# Patient Record
Sex: Male | Born: 1935 | Race: White | Hispanic: No | Marital: Married | Smoking: Former smoker
Health system: Southern US, Community
[De-identification: ages and names within clinical notes are randomized; demographics above are authoritative.]

## PROBLEM LIST (undated history)

## (undated) DIAGNOSIS — IMO0001 Reserved for inherently not codable concepts without codable children: Secondary | ICD-10-CM

## (undated) DIAGNOSIS — K222 Esophageal obstruction: Secondary | ICD-10-CM

## (undated) DIAGNOSIS — I10 Essential (primary) hypertension: Secondary | ICD-10-CM

## (undated) DIAGNOSIS — E785 Hyperlipidemia, unspecified: Secondary | ICD-10-CM

## (undated) DIAGNOSIS — C189 Malignant neoplasm of colon, unspecified: Secondary | ICD-10-CM

## (undated) DIAGNOSIS — Z5189 Encounter for other specified aftercare: Secondary | ICD-10-CM

## (undated) DIAGNOSIS — G14 Postpolio syndrome: Secondary | ICD-10-CM

## (undated) DIAGNOSIS — C61 Malignant neoplasm of prostate: Secondary | ICD-10-CM

## (undated) DIAGNOSIS — K259 Gastric ulcer, unspecified as acute or chronic, without hemorrhage or perforation: Secondary | ICD-10-CM

## (undated) HISTORY — DX: Esophageal obstruction: K22.2

## (undated) HISTORY — DX: Reserved for inherently not codable concepts without codable children: IMO0001

## (undated) HISTORY — DX: Malignant neoplasm of prostate: C61

## (undated) HISTORY — DX: Essential (primary) hypertension: I10

## (undated) HISTORY — DX: Encounter for other specified aftercare: Z51.89

## (undated) HISTORY — DX: Postpolio syndrome: G14

## (undated) HISTORY — DX: Gastric ulcer, unspecified as acute or chronic, without hemorrhage or perforation: K25.9

## (undated) HISTORY — DX: Hyperlipidemia, unspecified: E78.5

## (undated) HISTORY — DX: Malignant neoplasm of colon, unspecified: C18.9

## (undated) HISTORY — PX: CATARACT EXTRACTION: SUR2

## (undated) HISTORY — PX: TOTAL KNEE ARTHROPLASTY: SHX125

## (undated) HISTORY — PX: PROSTATE SURGERY: SHX751

---
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
## 1963-09-13 HISTORY — PX: APPENDECTOMY: SHX54

## 1963-09-13 HISTORY — PX: HERNIA REPAIR: SHX51

## 1989-09-12 HISTORY — PX: COLON SURGERY: SHX602

## 1992-09-12 HISTORY — PX: CORONARY ARTERY BYPASS GRAFT: SHX141

## 1992-09-12 HISTORY — PX: CORONARY ANGIOPLASTY WITH STENT PLACEMENT: SHX49

## 2000-06-06 ENCOUNTER — Encounter: Payer: Self-pay | Admitting: *Deleted

## 2000-06-06 ENCOUNTER — Encounter: Admission: RE | Admit: 2000-06-06 | Discharge: 2000-06-06 | Payer: Self-pay | Admitting: *Deleted

## 2006-10-13 ENCOUNTER — Emergency Department (HOSPITAL_COMMUNITY): Admission: EM | Admit: 2006-10-13 | Discharge: 2006-10-13 | Payer: Self-pay | Admitting: Family Medicine

## 2006-10-15 ENCOUNTER — Emergency Department (HOSPITAL_COMMUNITY): Admission: EM | Admit: 2006-10-15 | Discharge: 2006-10-15 | Payer: Self-pay | Admitting: Family Medicine

## 2006-10-22 ENCOUNTER — Emergency Department (HOSPITAL_COMMUNITY): Admission: EM | Admit: 2006-10-22 | Discharge: 2006-10-22 | Payer: Self-pay | Admitting: Emergency Medicine

## 2006-10-25 ENCOUNTER — Emergency Department (HOSPITAL_COMMUNITY): Admission: EM | Admit: 2006-10-25 | Discharge: 2006-10-25 | Payer: Self-pay | Admitting: Emergency Medicine

## 2008-01-18 ENCOUNTER — Emergency Department (HOSPITAL_COMMUNITY): Admission: EM | Admit: 2008-01-18 | Discharge: 2008-01-18 | Payer: Self-pay | Admitting: Family Medicine

## 2008-01-22 ENCOUNTER — Emergency Department (HOSPITAL_COMMUNITY): Admission: EM | Admit: 2008-01-22 | Discharge: 2008-01-22 | Payer: Self-pay | Admitting: Emergency Medicine

## 2008-11-19 ENCOUNTER — Ambulatory Visit: Payer: Self-pay | Admitting: Family Medicine

## 2008-11-19 DIAGNOSIS — I2510 Atherosclerotic heart disease of native coronary artery without angina pectoris: Secondary | ICD-10-CM | POA: Insufficient documentation

## 2008-11-19 DIAGNOSIS — M1990 Unspecified osteoarthritis, unspecified site: Secondary | ICD-10-CM | POA: Insufficient documentation

## 2008-11-19 DIAGNOSIS — I10 Essential (primary) hypertension: Secondary | ICD-10-CM | POA: Insufficient documentation

## 2008-11-19 DIAGNOSIS — Z8546 Personal history of malignant neoplasm of prostate: Secondary | ICD-10-CM

## 2008-11-19 DIAGNOSIS — E785 Hyperlipidemia, unspecified: Secondary | ICD-10-CM | POA: Insufficient documentation

## 2008-11-19 DIAGNOSIS — Z951 Presence of aortocoronary bypass graft: Secondary | ICD-10-CM

## 2008-11-19 DIAGNOSIS — E7800 Pure hypercholesterolemia, unspecified: Secondary | ICD-10-CM | POA: Insufficient documentation

## 2008-11-20 ENCOUNTER — Encounter: Payer: Self-pay | Admitting: *Deleted

## 2008-11-24 ENCOUNTER — Encounter: Payer: Self-pay | Admitting: Family Medicine

## 2008-11-25 ENCOUNTER — Encounter: Payer: Self-pay | Admitting: Cardiovascular Disease

## 2008-11-25 ENCOUNTER — Ambulatory Visit: Payer: Self-pay | Admitting: Cardiovascular Disease

## 2008-11-26 ENCOUNTER — Encounter: Payer: Self-pay | Admitting: Family Medicine

## 2008-12-18 ENCOUNTER — Encounter: Payer: Self-pay | Admitting: Family Medicine

## 2008-12-23 ENCOUNTER — Telehealth (INDEPENDENT_AMBULATORY_CARE_PROVIDER_SITE_OTHER): Payer: Self-pay | Admitting: *Deleted

## 2008-12-24 ENCOUNTER — Encounter: Payer: Self-pay | Admitting: Cardiovascular Disease

## 2008-12-24 ENCOUNTER — Ambulatory Visit: Payer: Self-pay

## 2008-12-24 ENCOUNTER — Encounter: Payer: Self-pay | Admitting: Cardiology

## 2008-12-31 ENCOUNTER — Telehealth: Payer: Self-pay | Admitting: Cardiovascular Disease

## 2009-05-19 ENCOUNTER — Ambulatory Visit: Payer: Self-pay | Admitting: Cardiovascular Disease

## 2009-10-07 ENCOUNTER — Encounter (INDEPENDENT_AMBULATORY_CARE_PROVIDER_SITE_OTHER): Payer: Self-pay | Admitting: *Deleted

## 2009-11-10 ENCOUNTER — Ambulatory Visit: Payer: Self-pay | Admitting: Family Medicine

## 2009-11-10 ENCOUNTER — Encounter: Payer: Self-pay | Admitting: Family Medicine

## 2009-11-10 DIAGNOSIS — F4321 Adjustment disorder with depressed mood: Secondary | ICD-10-CM | POA: Insufficient documentation

## 2009-11-10 LAB — CONVERTED CEMR LAB
ALT: 11 units/L (ref 0–53)
AST: 14 units/L (ref 0–37)
Albumin: 4.7 g/dL (ref 3.5–5.2)
Alkaline Phosphatase: 64 units/L (ref 39–117)
BUN: 21 mg/dL (ref 6–23)
CO2: 24 meq/L (ref 19–32)
Calcium: 9.1 mg/dL (ref 8.4–10.5)
Chloride: 104 meq/L (ref 96–112)
Cholesterol: 130 mg/dL (ref 0–200)
Creatinine, Ser: 1.15 mg/dL (ref 0.40–1.50)
Glucose, Bld: 101 mg/dL — ABNORMAL HIGH (ref 70–99)
HCT: 41.7 % (ref 39.0–52.0)
HDL: 43 mg/dL (ref >39)
Hemoglobin: 13.9 g/dL (ref 13.0–17.0)
LDL Cholesterol: 47 mg/dL (ref 0–99)
MCHC: 33.3 g/dL (ref 30.0–36.0)
MCV: 86.5 fL (ref 78.0–100.0)
PSA: 0.5 ng/mL (ref 0.10–4.00)
Platelets: 203 10*3/uL (ref 150–400)
Potassium: 4 meq/L (ref 3.5–5.3)
RBC: 4.82 M/uL (ref 4.22–5.81)
RDW: 13 % (ref 11.5–15.5)
Sodium: 138 meq/L (ref 135–145)
Total Bilirubin: 0.9 mg/dL (ref 0.3–1.2)
Total CHOL/HDL Ratio: 3
Total Protein: 6.4 g/dL (ref 6.0–8.3)
Triglycerides: 202 mg/dL — ABNORMAL HIGH (ref <150)
VLDL: 40 mg/dL (ref 0–40)
WBC: 5.8 10*3/uL (ref 4.0–10.5)

## 2009-11-15 ENCOUNTER — Encounter: Payer: Self-pay | Admitting: Family Medicine

## 2009-11-18 ENCOUNTER — Ambulatory Visit: Payer: Self-pay | Admitting: Cardiovascular Disease

## 2009-12-11 ENCOUNTER — Telehealth (INDEPENDENT_AMBULATORY_CARE_PROVIDER_SITE_OTHER): Payer: Self-pay | Admitting: *Deleted

## 2010-02-23 ENCOUNTER — Ambulatory Visit: Payer: Self-pay | Admitting: Family Medicine

## 2010-02-23 DIAGNOSIS — N400 Enlarged prostate without lower urinary tract symptoms: Secondary | ICD-10-CM

## 2010-02-23 DIAGNOSIS — K5900 Constipation, unspecified: Secondary | ICD-10-CM | POA: Insufficient documentation

## 2010-02-23 LAB — CONVERTED CEMR LAB: Cholesterol, target level: 200 mg/dL

## 2010-02-24 DIAGNOSIS — Z85038 Personal history of other malignant neoplasm of large intestine: Secondary | ICD-10-CM | POA: Insufficient documentation

## 2010-03-25 ENCOUNTER — Telehealth: Payer: Self-pay | Admitting: Cardiovascular Disease

## 2010-05-31 ENCOUNTER — Telehealth: Payer: Self-pay | Admitting: Cardiovascular Disease

## 2010-06-25 ENCOUNTER — Ambulatory Visit: Payer: Self-pay | Admitting: Cardiovascular Disease

## 2010-06-25 DIAGNOSIS — IMO0001 Reserved for inherently not codable concepts without codable children: Secondary | ICD-10-CM

## 2010-06-29 LAB — CONVERTED CEMR LAB
ALT: 14 units/L (ref 0–53)
CO2: 24 meq/L (ref 19–32)
Chloride: 101 meq/L (ref 96–112)
Creatinine, Ser: 1.08 mg/dL (ref 0.40–1.50)
Indirect Bilirubin: 0.9 mg/dL (ref 0.0–0.9)
Total Protein: 6.6 g/dL (ref 6.0–8.3)

## 2010-08-11 ENCOUNTER — Ambulatory Visit: Payer: Self-pay

## 2010-09-15 ENCOUNTER — Ambulatory Visit: Admission: RE | Admit: 2010-09-15 | Discharge: 2010-09-15 | Payer: Self-pay | Source: Home / Self Care

## 2010-09-15 ENCOUNTER — Encounter (INDEPENDENT_AMBULATORY_CARE_PROVIDER_SITE_OTHER): Payer: Self-pay | Admitting: *Deleted

## 2010-09-15 ENCOUNTER — Encounter: Payer: Self-pay | Admitting: Family Medicine

## 2010-09-15 ENCOUNTER — Ambulatory Visit: Admit: 2010-09-15 | Payer: Self-pay

## 2010-09-15 DIAGNOSIS — L919 Hypertrophic disorder of the skin, unspecified: Secondary | ICD-10-CM

## 2010-09-15 DIAGNOSIS — L909 Atrophic disorder of skin, unspecified: Secondary | ICD-10-CM | POA: Insufficient documentation

## 2010-09-15 DIAGNOSIS — K625 Hemorrhage of anus and rectum: Secondary | ICD-10-CM | POA: Insufficient documentation

## 2010-09-15 LAB — CONVERTED CEMR LAB
MCHC: 34.4 g/dL (ref 30.0–36.0)
MCV: 86.1 fL (ref 78.0–100.0)
Platelets: 196 10*3/uL (ref 150–400)
WBC: 5.9 10*3/uL (ref 4.0–10.5)

## 2010-09-23 ENCOUNTER — Encounter: Payer: Self-pay | Admitting: Gastroenterology

## 2010-09-23 ENCOUNTER — Ambulatory Visit
Admission: RE | Admit: 2010-09-23 | Discharge: 2010-09-23 | Payer: Self-pay | Source: Home / Self Care | Attending: Gastroenterology | Admitting: Gastroenterology

## 2010-09-23 DIAGNOSIS — K432 Incisional hernia without obstruction or gangrene: Secondary | ICD-10-CM | POA: Insufficient documentation

## 2010-09-24 ENCOUNTER — Ambulatory Visit
Admission: RE | Admit: 2010-09-24 | Discharge: 2010-09-24 | Payer: Self-pay | Source: Home / Self Care | Attending: Gastroenterology | Admitting: Gastroenterology

## 2010-09-24 ENCOUNTER — Encounter: Payer: Self-pay | Admitting: Gastroenterology

## 2010-09-27 ENCOUNTER — Telehealth: Payer: Self-pay | Admitting: Gastroenterology

## 2010-09-28 ENCOUNTER — Encounter: Payer: Self-pay | Admitting: Gastroenterology

## 2010-09-29 ENCOUNTER — Encounter: Payer: Self-pay | Admitting: *Deleted

## 2010-10-04 ENCOUNTER — Encounter (INDEPENDENT_AMBULATORY_CARE_PROVIDER_SITE_OTHER): Payer: Self-pay | Admitting: *Deleted

## 2010-10-04 ENCOUNTER — Encounter: Payer: Self-pay | Admitting: Family Medicine

## 2010-10-07 ENCOUNTER — Ambulatory Visit: Admission: RE | Admit: 2010-10-07 | Discharge: 2010-10-07 | Payer: Self-pay | Source: Home / Self Care

## 2010-10-12 NOTE — Assessment & Plan Note (Signed)
Summary: htn  401.1  BP  walk in   pfh,rn  Nurse Visit   Vital Signs:  Patient profile:   76 year old male Height:      68 inches Weight:      197 pounds Pulse rate:   85 / minute Pulse rhythm:   regular BP sitting:   135 / 81  (left arm)  Current Medications (verified): 1)  Terazosin Hcl 5 Mg Caps (Terazosin Hcl) .Marland Kitchen.. 1 Tab By Mouth Once Daily 2)  Meclizine Hcl 25 Mg Tabs (Meclizine Hcl) .... As Needed 3)  Fish Oil   Oil (Fish Oil) .Marland Kitchen.. 1 Tab By Mouth Once Daily 4)  Omega-3 350 Mg Caps (Omega-3 Fatty Acids) .Marland Kitchen.. 1 Tab By Mouth Once Daily 5)  Centrum Silver  Tabs (Multiple Vitamins-Minerals) .Marland Kitchen.. 1 Tab By Mouth Once Daily 6)  Vitamin E 400 Unit Caps (Vitamin E) .Marland Kitchen.. 1 Tab By Mouth Once Daily 7)  Vitamin D3 2000 Unit Caps (Cholecalciferol) .Marland Kitchen.. 1 Tab By Mouth Once Daily 8)  Vitamin B-12 1000 Mcg Tabs (Cyanocobalamin) .Marland Kitchen.. 1 Tab By Mouth Once Daily 9)  Cinnamon 500 Mg Caps (Cinnamon) .Marland Kitchen.. 1 Tab By Mouth Once Daily 10)  Tenormin 25 Mg Tabs (Atenolol) .Marland Kitchen.. 1 Tab Daily 11)  Aspirin 81 Mg  Tabs (Aspirin) .Marland Kitchen.. 1 Tab By Mouth Once Daily 12)  Coq10 50 Mg Caps (Coenzyme Q10) .Marland Kitchen.. 1 Tab By Mouth Once Daily 13)  Senna 187 Mg Tabs (Senna) .... Take 1 Dialy For Constipation As Needed 14)  Mineral Oil  Oil (Mineral Oil) .... Three Times A Day 15)  Miralax  Powd (Polyethylene Glycol 3350) .... As Needed  Allergies (verified): No Known Drug Allergies Pt walked in today for a BP check - stated he had a h/a last night and his BP was elevated though he doesn't remember what it was.  He went by [facility] this am and re-checked it and it was 135/70.  In office now BP 135/81 and HR 85.  Pt denies any complaints at this time.  He is aware to continue to check his BP and report any elevated pressures that remain consistant.

## 2010-10-12 NOTE — Letter (Signed)
Summary: Lipid Letter  [facility]  [address]   [place], [place] [address]   Phone: [phone]  Fax: [phone]    11/15/2009  Jacob Norton [address] [place], [place]  [address]  Dear Jacob Norton:  We have carefully reviewed your last lipid profile from 11/10/2009 and the results are noted below with a summary of recommendations for lipid management.    Cholesterol:       130     Goal: <200   HDL "good" Cholesterol:   43     Goal: >40   LDL "bad" Cholesterol:   47     Goal: <90   Triglycerides:       202     Goal: <150        TLC Diet (Therapeutic Lifestyle Change): Saturated Fats & Transfatty acids should be kept < 7% of total calories ***Reduce Saturated Fats Polyunstaurated Fat can be up to 10% of total calories Monounsaturated Fat Fat can be up to 20% of total calories Total Fat should be no greater than 25-35% of total calories Carbohydrates should be 50-60% of total calories Protein should be approximately 15% of total calories Fiber should be at least 20-30 grams a day ***Increased fiber may help lower LDL Total Cholesterol should be < 200mg /day Consider adding plant stanol/sterols to diet (example: Benacol spread) ***A higher intake of unsaturated fat may reduce Triglycerides and Increase HDL    Adjunctive Measures (may lower LIPIDS and reduce risk of Heart Attack) include: Aerobic Exercise (20-30 minutes 3-4 times a week) Limit Alcohol Consumption Weight Reduction Aspirin 75-81 mg a day by mouth (if not allergic or contraindicated) Dietary Fiber 20-30 grams a day by mouth     Current Medications: 1)    Terazosin Hcl 5 Mg Caps (Terazosin hcl) .Marland Kitchen.. 1 tab by mouth once daily 2)    Simvastatin 80 Mg Tabs (Simvastatin) .Marland Kitchen.. 1 tab by mouth once daily 3)    Meclizine Hcl 25 Mg Tabs (Meclizine hcl) .... As needed 4)    Fish Oil   Oil (Fish oil) .Marland Kitchen.. 1 tab by mouth once daily 5)    Omega-3 350 Mg Caps (Omega-3 fatty acids) .Marland Kitchen.. 1 tab by mouth once  daily 6)    Cod Liver Oil   Oil (Cod liver oil) .Marland Kitchen.. 1 tab by mouth once daily 7)    Centrum Silver  Tabs (Multiple vitamins-minerals) .Marland Kitchen.. 1 tab by mouth once daily 8)    Vitamin E 400 Unit Caps (Vitamin e) .Marland Kitchen.. 1 tab by mouth once daily 9)    Vitamin D3 2000 Unit Caps (Cholecalciferol) .Marland Kitchen.. 1 tab by mouth once daily 10)    Vitamin B-12 1000 Mcg Tabs (Cyanocobalamin) .Marland Kitchen.. 1 tab by mouth once daily 11)    Cinnamon 500 Mg Caps (Cinnamon) .Marland Kitchen.. 1 tab by mouth once daily 12)    Tenormin 25 Mg Tabs (Atenolol) .Marland Kitchen.. 1 tab daily 13)    Aspirin 81 Mg  Tabs (Aspirin) .Marland Kitchen.. 1 tab by mouth once daily 14)    Coq10 50 Mg Caps (Coenzyme q10) .Marland Kitchen.. 1 tab by mouth once daily 15)    Senna 187 Mg Tabs (Senna) .... Take 1 dialy for constipation as needed  If you have any questions, please call. We appreciate being able to work with you.   Sincerely,    [facility] [name] DO  Appended Document: Lipid Letter mailed.

## 2010-10-12 NOTE — Progress Notes (Signed)
Summary: question re crestor  Phone Note Call from Patient   Caller: Patient [phone] Reason for Call: Talk to Nurse Summary of Call: pt on crestor-feels sleepy, legs are hurting, pt stopped med 2-3 days ago-pain subsided-pls advise-has appt 10-14 can he stop crestor until then Initial call taken by: Glynda Jaeger,  May 31, 2010 8:53 AM  Follow-up for Phone Call        pt was switched from simva 80 to crestor 40 about a month ago and since then he has dev. pain and cramping in legs and increased fatigued, he stopped Crestor 2-3 days ago and can tell an improvement, he will cont. to hold  med for now we will send info to Dr Eden Emms to review next week Meredith Staggers, RN  May 31, 2010 9:03 AM   Additional Follow-up for Phone Call Additional follow up Details #1::        yes stop crestor for now Additional Follow-up by: Colon Branch, MD, [facility],  June 06, 2010 9:05 PM    Additional Follow-up for Phone Call Additional follow up Details #2::    pt aware Deliah Goody, RN  June 07, 2010 4:29 PM

## 2010-10-12 NOTE — Progress Notes (Signed)
Summary: Calling regarding medication  Phone Note Call from Patient Call back at Home Phone [phone]   Caller: Patient Summary of Call: Pt calling regarding medication Simvastatin Initial call taken by: Judie Grieve,  March 25, 2010 8:17 AM  Follow-up for Phone Call        I spoke with Mr. Barefoot about his Simvastatin 80mg .  He has recently ,with exercise, experienced more soreness in his legs.  His pharmacist had alerted him to the muscle issues with Simvastatin.  He wanted to know if he needed to change his dosage.  I will forward this to Dr. Eden Emms for further instructions. Lisabeth Devoid RN Stop Simvastatin. Start Crestor 40mg  at bedtime per Dr. Eden Emms Pt is aware and medication ordered. Lisabeth Devoid RN    New/Updated Medications: CRESTOR 40 MG TABS (ROSUVASTATIN CALCIUM) Take 1 tablet every evening Prescriptions: CRESTOR 40 MG TABS (ROSUVASTATIN CALCIUM) Take 1 tablet every evening  #30 x 11   Entered by:   Lisabeth Devoid RN   Authorized by:   Colon Branch, MD, [facility]   Signed by:   Lisabeth Devoid RN on 03/25/2010   Method used:   Electronically to        [facility]  [address] [phone]* (retail)       [address]       [place], [place]  [address]       Ph: [phone] or [phone]       Fax: [phone]   RxID:   [id number]

## 2010-10-12 NOTE — Progress Notes (Signed)
Summary: Records request from [facility]  Request for records received from [facility]. Request forwarded to [facility]. [name]  December 11, 2009 9:48 AM

## 2010-10-12 NOTE — Assessment & Plan Note (Signed)
Summary: 6 month rov/sl   Primary Provider:  [name] DO  CC:  check up.  History of Present Illness: Jacob Norton is seen today for F/U.  He has distant history of CAD with CABG in the 80's.  he had a normal myovue in 4/10 and has normal EF.  He has had a rough year with his sone being killed and his wife dying a month ago of chronic lung disease.  He denies SSCP, palpitations, dyspnea or syncope.  He still has 3 children living but gets bored at night.  He has been compliant with his meds and is sedantary except for walking  Current Problems (verified): 1)  Mourning  (ICD-309.0) 2)  Hyperlipidemia  (ICD-272.4) 3)  Osteoarthritis  (ICD-715.90) 4)  Prostate Cancer, Hx of  (ICD-V10.46) 5)  Coronary Artery Disease  (ICD-414.00) 6)  Personal History Malignant Neoplasm Prostate  (ICD-V10.46) 7)  Hypercholesterolemia  (ICD-272.0) 8)  Hypertension  (ICD-401.9) 9)  Coronary Artery Bypass Graft, Hx of  (ICD-V45.81)  Current Medications (verified): 1)  Terazosin Hcl 5 Mg Caps (Terazosin Hcl) .Marland Kitchen.. 1 Tab By Mouth Once Daily 2)  Simvastatin 80 Mg Tabs (Simvastatin) .Marland Kitchen.. 1 Tab By Mouth Once Daily 3)  Meclizine Hcl 25 Mg Tabs (Meclizine Hcl) .... As Needed 4)  Fish Oil   Oil (Fish Oil) .Marland Kitchen.. 1 Tab By Mouth Once Daily 5)  Omega-3 350 Mg Caps (Omega-3 Fatty Acids) .Marland Kitchen.. 1 Tab By Mouth Once Daily 6)  Centrum Silver  Tabs (Multiple Vitamins-Minerals) .Marland Kitchen.. 1 Tab By Mouth Once Daily 7)  Vitamin E 400 Unit Caps (Vitamin E) .Marland Kitchen.. 1 Tab By Mouth Once Daily 8)  Vitamin D3 2000 Unit Caps (Cholecalciferol) .Marland Kitchen.. 1 Tab By Mouth Once Daily 9)  Vitamin B-12 1000 Mcg Tabs (Cyanocobalamin) .Marland Kitchen.. 1 Tab By Mouth Once Daily 10)  Cinnamon 500 Mg Caps (Cinnamon) .Marland Kitchen.. 1 Tab By Mouth Once Daily 11)  Tenormin 25 Mg Tabs (Atenolol) .Marland Kitchen.. 1 Tab Daily 12)  Aspirin 81 Mg  Tabs (Aspirin) .Marland Kitchen.. 1 Tab By Mouth Once Daily 13)  Coq10 50 Mg Caps (Coenzyme Q10) .Marland Kitchen.. 1 Tab By Mouth Once Daily 14)  Senna 187 Mg Tabs (Senna) .... Take 1  Dialy For Constipation As Needed  Allergies (verified): No Known Drug Allergies  Past History:  Past Medical History: Last updated: 11/19/2008 Coronary artery disease s/p CABG Prostate cancer, hx of Osteoarthritis Hyperlipidemia Hypertension  Family History: Last updated: 11/25/2008 non-contributory  Social History: Last updated: Dec 03, 2009 Wife, Okey Regal passed away in 2009/10/17.  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  Continues to drink 1-2 beers daily.   Review of Systems       Denies fever, malais, weight loss, blurry vision, decreased visual acuity, cough, sputum, SOB, hemoptysis, pleuritic pain, palpitaitons, heartburn, abdominal pain, melena, lower extremity edema, claudication, or rash.   Vital Signs:  Patient profile:   76 year old male Height:      68 inches Weight:      190 pounds BMI:     28.99 Pulse rate:   58 / minute Resp:     12 per minute BP sitting:   149 / 77  (left arm)  Vitals Entered By: Kem Parkinson (November 18, 2009 9:06 AM)  Physical Exam  General:  Affect appropriate Healthy:  appears stated age HEENT: normal Neck supple  with no adenopathy JVP normal no bruits no thyromegaly Lungs clear with no wheezing and good diaphragmatic motion Heart:  S1/S2 no murmur,rub, gallop or click PMI normal Abdomen: benighn, BS positve, no tenderness, no AAA no bruit.  No HSM or HJR Distal pulses intact with no bruits No edema Neuro non-focal Skin warm and dry S/P sternotomy Midline abdominal scar Multiple tatoos   Impression & Recommendations:  Problem # 1:  HYPERLIPIDEMIA (ICD-272.4) At target with no side effects His updated medication list for this problem includes:    Simvastatin 80 Mg Tabs (Simvastatin) .Marland Kitchen... 1 tab by mouth once daily  CHOL: 130 (11/10/2009)   LDL: 47 (11/10/2009)   HDL: 43 (11/10/2009)    TG: 202 (11/10/2009)  Problem # 2:  CORONARY ARTERY DISEASE (ICD-414.00) No angina non ischemic myovue 4/10  distant CABG His updated medication list for this problem includes:    Tenormin 25 Mg Tabs (Atenolol) .Marland Kitchen... 1 tab daily    Aspirin 81 Mg Tabs (Aspirin) .Marland Kitchen... 1 tab by mouth once daily  Problem # 3:  HYPERTENSION (ICD-401.9) Well controlled His updated medication list for this problem includes:    Terazosin Hcl 5 Mg Caps (Terazosin hcl) .Marland Kitchen... 1 tab by mouth once daily    Tenormin 25 Mg Tabs (Atenolol) .Marland Kitchen... 1 tab daily    Aspirin 81 Mg Tabs (Aspirin) .Marland Kitchen... 1 tab by mouth once daily  Patient Instructions: 1)  Your physician recommends that you schedule a follow-up appointment in: 6 MONTHS   EKG Report  Procedure date:  11/18/2009  Findings:      NSR 58 LAD ICRBBB ABnormal ECG

## 2010-10-12 NOTE — Assessment & Plan Note (Signed)
Summary: F6M/DM   Primary Jacob Norton:  Jacob Grove DO   History of Present Illness: Jacob Norton is seen today for F/U.  He has distant history of CAD with CABG in the 80's.  he had a normal myovue in 4/10 and has normal EF.  He has had a rough year with his sone being killed and his wife dying a month ago of chronic lung disease.  He denies SSCP, palpitations, dyspnea or syncope.  He still has 3 children living but gets bored at night.  He has been compliant with his meds and is sedantary except for walking  He had multiple cholesterol pills in his bag.  His simvastatin 80 mg was stopped due to FDA warning.  He got bad leg pain with crestor.  I am not sure why he had vytorin in his bag.  I told him we needed to check some lab work regarding his leg pain and statin and if ok he could go back on simvastatin 40mg .  He has no claudication and normal pulses on exam  His son Jacob Norton works in Consulting civil engineer at [employer].    Current Problems (verified): 1)  Colon Cancer, Hx of  (ICD-V10.05) 2)  Constipation  (ICD-564.00) 3)  Benign Prostatic Hypertrophy, Hx of  (ICD-V13.8) 4)  Mourning  (ICD-309.0) 5)  Hyperlipidemia  (ICD-272.4) 6)  Osteoarthritis  (ICD-715.90) 7)  Prostate Cancer, Hx of  (ICD-V10.46) 8)  Coronary Artery Disease  (ICD-414.00) 9)  Personal History Malignant Neoplasm Prostate  (ICD-V10.46) 10)  Hypercholesterolemia  (ICD-272.0) 11)  Hypertension  (ICD-401.9) 12)  Coronary Artery Bypass Graft, Hx of  (ICD-V45.81)  Current Medications (verified): 1)  Terazosin Hcl 5 Mg Caps (Terazosin Hcl) .Marland Kitchen.. 1 Tab By Mouth Once Daily 2)  Meclizine Hcl 25 Mg Tabs (Meclizine Hcl) .... As Needed 3)  Fish Oil   Oil (Fish Oil) .Marland Kitchen.. 1 Tab By Mouth Once Daily 4)  Omega-3 350 Mg Caps (Omega-3 Fatty Acids) .Marland Kitchen.. 1 Tab By Mouth Once Daily 5)  Centrum Silver  Tabs (Multiple Vitamins-Minerals) .Marland Kitchen.. 1 Tab By Mouth Once Daily 6)  Vitamin E 400 Unit Caps (Vitamin E) .Marland Kitchen.. 1 Tab By Mouth Once Daily 7)  Vitamin D3 2000 Unit Caps  (Cholecalciferol) .Marland Kitchen.. 1 Tab By Mouth Once Daily 8)  Vitamin B-12 1000 Mcg Tabs (Cyanocobalamin) .Marland Kitchen.. 1 Tab By Mouth Once Daily 9)  Cinnamon 500 Mg Caps (Cinnamon) .Marland Kitchen.. 1 Tab By Mouth Once Daily 10)  Tenormin 25 Mg Tabs (Atenolol) .Marland Kitchen.. 1 Tab Daily 11)  Aspirin 81 Mg  Tabs (Aspirin) .Marland Kitchen.. 1 Tab By Mouth Once Daily 12)  Coq10 50 Mg Caps (Coenzyme Q10) .Marland Kitchen.. 1 Tab By Mouth Once Daily 13)  Senna 187 Mg Tabs (Senna) .... Take 1 Dialy For Constipation As Needed 14)  Mineral Oil  Oil (Mineral Oil) .... Three Times A Day 15)  Miralax  Powd (Polyethylene Glycol 3350) .... As Needed  Allergies (verified): No Known Drug Allergies  Past History:  Past Medical History: Last updated: 25-Feb-2010 Coronary artery disease s/p CABG Prostate cancer, hx of Osteoarthritis Hyperlipidemia Hypertension Colon cancer, hx of  Past Surgical History: Last updated: 2010-02-25 Coronary artery bypass graft Total knee replacement colon ca w/radiation and surgical removement of part of colon  Family History: Last updated: 11/25/2008 non-contributory  Social History: Last updated: 02/25/2010 Wife, Jacob Norton passed away in 09-25-2009.  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  Continues to drink 1-2 beers daily.  Retired Conservation officer, nature  Review of Systems       Denies fever, malais, weight loss, blurry vision, decreased visual acuity, cough, sputum, SOB, hemoptysis, pleuritic pain, palpitaitons, heartburn, abdominal pain, melena, lower extremity edema, claudication, or rash.   Vital Signs:  Patient profile:   76 year old male Height:      68 inches Weight:      196 pounds BMI:     29.91 Pulse rate:   63 / minute Resp:     16 per minute BP sitting:   132 / 76  (right arm)  Vitals Entered By: Marrion Coy, CNA (June 25, 2010 2:50 PM)  Physical Exam  General:  Affect  appropriate Healthy:  appears stated age HEENT: normal Neck supple with no adenopathy JVP normal no bruits no thyromegaly Lungs clear with no wheezing and good diaphragmatic motion Heart:  S1/S2 no murmur,rub, gallop or click PMI normal Abdomen: benighn, BS positve, no tenderness, no AAA no bruit.  No HSM or HJR Distal pulses intact with no bruits No edema Neuro non-focal Skin warm and dry    Impression & Recommendations:  Problem # 1:  MUSCLE PAIN (ICD-729.1) Check labs.  Restart low dose simvastatin Orders: TLB-Hepatic/Liver Function Pnl (80076-HEPATIC) TLB-CK Total Only(Creatine Kinase/CPK) (82550-CK)  Problem # 2:  HYPERLIPIDEMIA (ICD-272.4) Labs in 3 months once simvastatin restarted.   The following medications were removed from the medication list:    Crestor 40 Mg Tabs (Rosuvastatin calcium) .Marland Kitchen... Take 1 tablet every evening  Problem # 3:  CORONARY ARTERY DISEASE (ICD-414.00)  Stable no angina continue asa and BB His updated medication list for this problem includes:    Tenormin 25 Mg Tabs (Atenolol) .Marland Kitchen... 1 tab daily    Aspirin 81 Mg Tabs (Aspirin) .Marland Kitchen... 1 tab by mouth once daily  His updated medication list for this problem includes:    Tenormin 25 Mg Tabs (Atenolol) .Marland Kitchen... 1 tab daily    Aspirin 81 Mg Tabs (Aspirin) .Marland Kitchen... 1 tab by mouth once daily  Problem # 4:  HYPERTENSION (ICD-401.9) Well controlled His updated medication list for this problem includes:    Terazosin Hcl 5 Mg Caps (Terazosin hcl) .Marland Kitchen... 1 tab by mouth once daily    Tenormin 25 Mg Tabs (Atenolol) .Marland Kitchen... 1 tab daily    Aspirin 81 Mg Tabs (Aspirin) .Marland Kitchen... 1 tab by mouth once daily  Orders: TLB-BMP (Basic Metabolic Panel-BMET) ([id number]-METABOL)  Patient Instructions: 1)  Your physician recommends that you schedule a follow-up appointment in: 6 MONTHS WITH DR Jacob Norton 2)  Your physician recommends that you continue on your current medications as directed. Please refer to the Current Medication  list given to you today. START SIMVASTATIN 40 MG IN 3 WEEKS 3)  Your physician recommends that you return for lab work in: TODAY BMET LIVER CPK 4)  FASTING  LIPID LIVER  [facility] 2012

## 2010-10-12 NOTE — Letter (Signed)
Summary: Appointment - Reminder 2  [facility], Main Office  [address]   [place], [place] [address]   Phone: [phone]  Fax: [phone]     October 07, 2009 MRN: [id number]   Jacob Norton [address] [place], [place]  [address]   Dear Mr. LEGATE,  Our records indicate that it is time to schedule a follow-up appointment with Dr. Eden Emms. It is very important that we reach you to schedule this appointment. We look forward to participating in your health care needs. Please contact us at the number listed above at your earliest convenience to schedule your appointment.  If you are unable to make an appointment at this time, give [place] a call so we can update our records.   Sincerely,   [facility] Scheduling Team

## 2010-10-12 NOTE — Assessment & Plan Note (Signed)
Summary: meet pcp & refill meds/[place]   Vital Signs:  Patient profile:   76 year old male Height:      68 inches Weight:      191.8 pounds BMI:     29.27 Temp:     98.1 degrees F oral Pulse rate:   64 / minute BP sitting:   145 / 74  (left arm) Cuff size:   regular  Vitals Entered By: Garen Grams LPN (November 10, 1608 9:06 AM) CC: Meet PCP, med refills Is Patient Diabetic? No Pain Assessment Patient in pain? no        Primary Care Provider:  [name] DO  CC:  Meet PCP and med refills.  History of Present Illness: 76 yo male, here for first visit, wants to establish primary care thru this office.  Know pt well as I was his wife's PCP. 1. Hx of CABG in 1980's, follows with [facility], last seen in 09/10.  Had a myouve in 12/2008 that was non-ischemic with an norml EF.Marland Kitchen Echo also confirmed normal EF with no valve disease.  Denies c/p, SOB, lightheadness, dizziness, syncope.  Takes meds as prescribed.  Needs to f/u with cards soon, has not yet scheduled. 2. Hx of prostate ca: Pt states he has been cancer free for a few years now and usually requires yearly follow up with a urologist in [place].  Would like me to check PSA's and referr back to urology if needed.  Denies increasing urinary frequency, hesitance, blood in urine.  Takes terazosin daily, needs refill.   3. HTN: stable, taking meds as directed by cards 4. HLD: Unsure of last cholestrol check, take simvastatin daily.   5. Recent death of wife.  Ms. Console passed away in 10-14-22 from multiple medical problems.  Mr. Hasler stayed very busy these past few years taking care of her.  Now unsure of how to spend him time, feels lonely especially at night.    Habits & Providers  Alcohol-Tobacco-Diet     Tobacco Status: never  Current Problems (verified): 1)  Hyperlipidemia  (ICD-272.4) 2)  Osteoarthritis  (ICD-715.90) 3)  Prostate Cancer, Hx of  (ICD-V10.46) 4)  Coronary Artery Disease  (ICD-414.00) 5)  Personal History  Malignant Neoplasm Prostate  (ICD-V10.46) 6)  Hypercholesterolemia  (ICD-272.0) 7)  Hypertension  (ICD-401.9) 8)  Coronary Artery Bypass Graft, Hx of  (ICD-V45.81)  Current Medications (verified): 1)  Terazosin Hcl 5 Mg Caps (Terazosin Hcl) .Marland Kitchen.. 1 Tab By Mouth Once Daily 2)  Simvastatin 80 Mg Tabs (Simvastatin) .Marland Kitchen.. 1 Tab By Mouth Once Daily 3)  Meclizine Hcl 25 Mg Tabs (Meclizine Hcl) .... As Needed 4)  Fish Oil   Oil (Fish Oil) .Marland Kitchen.. 1 Tab By Mouth Once Daily 5)  Omega-3 350 Mg Caps (Omega-3 Fatty Acids) .Marland Kitchen.. 1 Tab By Mouth Once Daily 6)  Cod Liver Oil   Oil (Cod Liver Oil) .Marland Kitchen.. 1 Tab By Mouth Once Daily 7)  Centrum Silver  Tabs (Multiple Vitamins-Minerals) .Marland Kitchen.. 1 Tab By Mouth Once Daily 8)  Vitamin E 400 Unit Caps (Vitamin E) .Marland Kitchen.. 1 Tab By Mouth Once Daily 9)  Vitamin D3 2000 Unit Caps (Cholecalciferol) .Marland Kitchen.. 1 Tab By Mouth Once Daily 10)  Vitamin B-12 1000 Mcg Tabs (Cyanocobalamin) .Marland Kitchen.. 1 Tab By Mouth Once Daily 11)  Cinnamon 500 Mg Caps (Cinnamon) .Marland Kitchen.. 1 Tab By Mouth Once Daily 12)  Tenormin 25 Mg Tabs (Atenolol) .Marland Kitchen.. 1 Tab Daily 13)  Aspirin 81 Mg  Tabs (Aspirin) .Marland KitchenMarland KitchenMarland Kitchen  1 Tab By Mouth Once Daily 14)  Coq10 50 Mg Caps (Coenzyme Q10) .Marland Kitchen.. 1 Tab By Mouth Once Daily 15)  Senna 187 Mg Tabs (Senna) .... Take 1 Dialy For Constipation As Needed  Allergies (verified): No Known Drug Allergies  Past History:  Past Medical History: Last updated: 11/19/2008 Coronary artery disease s/p CABG Prostate cancer, hx of Osteoarthritis Hyperlipidemia Hypertension  Family History: Last updated: 11/25/2008 non-contributory  Social History: Last updated: 23-Nov-2009 Wife, Okey Regal passed away in 10-06-09.  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  Continues to drink 1-2 beers daily.   Risk Factors: Smoking Status: never (11/23/2009)  Social History: Wife, Okey Regal  passed away in 10/06/09.  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  Continues to drink 1-2 beers daily. Smoking Status:  never  Review of Systems  The patient denies weight loss, chest pain, syncope, dyspnea on exertion, peripheral edema, hemoptysis, melena, hematochezia, hematuria, incontinence, muscle weakness, depression, unusual weight change, and abnormal bleeding.    Physical Exam  General:  VS reviewed, Well-developed,well-nourished,in no acute distress; alert,appropriate and cooperative throughout examination Eyes:  vision grossly intact.   Mouth:  New tatoo on inside of bottom lip, no redness or swelling of lips, not infectious looking Neck:  supple, full ROM, no masses, and no JVD.   Lungs:  Normal respiratory effort, chest expands symmetrically. Lungs are clear to auscultation, no crackles or wheezes. Heart:  Normal rate and regular rhythm. S1 and S2 normal without gallop, murmur, click, rub or other extra sounds. Abdomen:  soft, non-tender, and normal bowel sounds.   Msk:  normal ROM.   Extremities:  No clubbing, cyanosis or edema Neurologic:  alert & oriented X3, cranial nerves II-XII intact, strength normal in all extremities, sensation intact to light touch, and gait normal.   Skin:  multiple tattoos, including a new one on his left hand and bottom lip   Cervical Nodes:  No lymphadenopathy noted Psych:  memory intact for recent and remote and good eye contact.     Impression & Recommendations:  Problem # 1:  CORONARY ARTERY DISEASE (ICD-414.00) Assessment Unchanged Followed by [facility], but pt would like to come here for regular check-up's and decrease cards visits.  Sees multiple specialists and is tired of so many office visits, wants primary doctor with specialists as needed.  Reviewed cards office notes, last echo was stable.  Pt  denies c/p, SOB.  Will continue current medication regimen and referr back to cards as necc.  His updated medication list for this problem includes:    Terazosin Hcl 5 Mg Caps (Terazosin hcl) .Marland Kitchen... 1 tab by mouth once daily    Tenormin 25 Mg Tabs (Atenolol) .Marland Kitchen... 1 tab daily    Aspirin 81 Mg Tabs (Aspirin) .Marland Kitchen... 1 tab by mouth once daily  Orders: CBC-[facility] ([address]) Comp Met-[facility] ([id number])  Problem # 2:  HYPERLIPIDEMIA (ICD-272.4) Assessment: Unchanged FLP today, will adjust meds as necc His updated medication list for this problem includes:    Simvastatin 80 Mg Tabs (Simvastatin) .Marland Kitchen... 1 tab by mouth once daily  Orders: Lipid-[facility] ([id number]) Comp Met-[facility] ([id number])  Problem # 3:  PROSTATE CANCER, HX OF (ICD-V10.46) Assessment:  Unchanged Will get records from urology.  Pt requesting PSA today.  Denies urinary changes Orders: PSA-[facility] ([id number]) [facility]- Est  Level 4 (86578)  Problem # 4:  HYPERTENSION (ICD-401.9) Assessment: Unchanged BP elevated today, will continue current meds and monitor His updated medication list for this problem includes:    Terazosin Hcl 5 Mg Caps (Terazosin hcl) .Marland Kitchen... 1 tab by mouth once daily    Tenormin 25 Mg Tabs (Atenolol) .Marland Kitchen... 1 tab daily  Problem # 5:  MOURNING (ICD-309.0) Assessment: New Claris Gower passed away on 10-25-09, she had been sick for some time, so death not unexpected.  Mr. Pumphrey seems to be doing well, going out, spending time with his grandchildren.  Is trying to get placement in a retirement community because he is too lonely at home.    Complete Medication List: 1)  Terazosin Hcl 5 Mg Caps (Terazosin hcl) .Marland Kitchen.. 1 tab by mouth once daily 2)  Simvastatin 80 Mg Tabs (Simvastatin) .Marland Kitchen.. 1 tab by mouth once daily 3)  Meclizine Hcl 25 Mg Tabs (Meclizine hcl) .... As needed 4)  Fish Oil Oil (Fish oil) .Marland Kitchen.. 1 tab by mouth once daily 5)  Omega-3 350 Mg Caps (Omega-3 fatty acids) .Marland Kitchen.. 1 tab by mouth once daily 6)  Cod Liver Oil Oil  (Cod liver oil) .Marland Kitchen.. 1 tab by mouth once daily 7)  Centrum Silver Tabs (Multiple vitamins-minerals) .Marland Kitchen.. 1 tab by mouth once daily 8)  Vitamin E 400 Unit Caps (Vitamin e) .Marland Kitchen.. 1 tab by mouth once daily 9)  Vitamin D3 2000 Unit Caps (Cholecalciferol) .Marland Kitchen.. 1 tab by mouth once daily 10)  Vitamin B-12 1000 Mcg Tabs (Cyanocobalamin) .Marland Kitchen.. 1 tab by mouth once daily 11)  Cinnamon 500 Mg Caps (Cinnamon) .Marland Kitchen.. 1 tab by mouth once daily 12)  Tenormin 25 Mg Tabs (Atenolol) .Marland Kitchen.. 1 tab daily 13)  Aspirin 81 Mg Tabs (Aspirin) .Marland Kitchen.. 1 tab by mouth once daily 14)  Coq10 50 Mg Caps (Coenzyme q10) .Marland Kitchen.. 1 tab by mouth once daily 15)  Senna 187 Mg Tabs (Senna) .... Take 1 dialy for constipation as needed  Patient Instructions: 1)  Nice to see you today. 2)  I am going to get some lab work today, I will call you if there is anything abnormal. 3)  Please call me if you need anything. 4)  [facility] 5)  Follow-up in 3 months or sooner if you need me. Prescriptions: SENNA 187 MG TABS (SENNA) Take 1 dialy for constipation as needed  #30 x 3   Entered and Authorized by:   [name] DO   Signed by:   [name] DO on 11/10/2009   Method used:   [facility]ically to        [facility]  [address] [phone]* (retail)       [address]       [place], [place]  [address]       Ph: [phone] or [phone]       Fax: [phone]   RxID:   [id number] SIMVASTATIN 80 MG TABS (SIMVASTATIN) 1 tab by mouth once daily  #90 Tablet x 2   Entered and Authorized by:   [name] DO   Signed by:   [name] DO on 11/10/2009   Method used:   [facility]        [facility] [address] [phone]* (retail)       [address]  [place], [place]  [address]       Ph: [phone] or [phone]       Fax: [phone]   RxID:   [id number] TERAZOSIN HCL 5 MG CAPS (TERAZOSIN HCL) 1 tab by mouth once daily  #30 x 5   Entered and Authorized by:   [name] DO   Signed by:    [name] DO on 11/10/2009   Method used:   [facility]        [facility] [address] [phone]* (retail)       [address]       [place], [place]  [address]       Ph: [phone] or [phone]       Fax: [phone]   RxID:   [id number]

## 2010-10-12 NOTE — Assessment & Plan Note (Signed)
Summary: refill meds/blood in stool,df   Vital Signs:  Patient profile:   76 year old male Height:      68 inches Weight:      194.5 pounds BMI:     29.68 Temp:     97.7 degrees F oral Pulse rate:   66 / minute BP sitting:   123 / 70  (left arm) Cuff size:   regular  Vitals Entered By: Garen Grams LPN (February 23, 2010 2:32 PM) CC: refill meds Is Patient Diabetic? No Pain Assessment Patient in pain? no        Primary Care Provider:  [name] DO  CC:  refill meds.  History of Present Illness: 76yo male with multiple medical issues to discuss: 1. Constipation: Chronic per pt report.  Has been using OTC herbal meds with some relief.  Paying greater than $70 monthly and would like something stroner.  No blood in stool.  BM's every 2-3 days, hard stools and straining.  Pt reports a 5-6 year hx of this.  Has been trying to increase fiber diet w/some imporvement.  Has been on mirilax in past with some relief.  Unsure of last colonoscopy. 2. CAD:  Sees [facility].  Last seen in 11/2009.  Denies recent c/p, no SOB, no dixxiness or lightheadness. 3. Mourning: doing better since wife passed away.  Staying active and socializing more.  4. BPH: stable.    Habits & Providers  Alcohol-Tobacco-Diet     Tobacco Status: never  Current Medications (verified): 1)  Terazosin Hcl 5 Mg Caps (Terazosin Hcl) .Marland Kitchen.. 1 Tab By Mouth Once Daily 2)  Simvastatin 80 Mg Tabs (Simvastatin) .Marland Kitchen.. 1 Tab By Mouth Once Daily 3)  Meclizine Hcl 25 Mg Tabs (Meclizine Hcl) .... As Needed 4)  Fish Oil   Oil (Fish Oil) .Marland Kitchen.. 1 Tab By Mouth Once Daily 5)  Omega-3 350 Mg Caps (Omega-3 Fatty Acids) .Marland Kitchen.. 1 Tab By Mouth Once Daily 6)  Centrum Silver  Tabs (Multiple Vitamins-Minerals) .Marland Kitchen.. 1 Tab By Mouth Once Daily 7)  Vitamin E 400 Unit Caps (Vitamin E) .Marland Kitchen.. 1 Tab By Mouth Once Daily 8)  Vitamin D3 2000 Unit Caps (Cholecalciferol) .Marland Kitchen.. 1 Tab By Mouth Once Daily 9)  Vitamin B-12 1000 Mcg Tabs (Cyanocobalamin) .Marland Kitchen..  1 Tab By Mouth Once Daily 10)  Cinnamon 500 Mg Caps (Cinnamon) .Marland Kitchen.. 1 Tab By Mouth Once Daily 11)  Tenormin 25 Mg Tabs (Atenolol) .Marland Kitchen.. 1 Tab Daily 12)  Aspirin 81 Mg  Tabs (Aspirin) .Marland Kitchen.. 1 Tab By Mouth Once Daily 13)  Coq10 50 Mg Caps (Coenzyme Q10) .Marland Kitchen.. 1 Tab By Mouth Once Daily 14)  Senna 187 Mg Tabs (Senna) .... Take 1 Dialy For Constipation As Needed 15)  Colace 100 Mg Caps (Docusate Sodium) .... Take 1 Pill By Mouth Bid 16)  Lactulose 20 Gm/81ml Soln (Lactulose) .... Take 30ml By Mouth Daily As Needed For Constipation 17)  Finasteride 5 Mg Tabs (Finasteride) .... Take 1 Pill By Mouth Daily  Allergies (verified): No Known Drug Allergies  Past History:  Past Medical History: Coronary artery disease s/p CABG Prostate cancer, hx of Osteoarthritis Hyperlipidemia Hypertension Colon cancer, hx of  Past Surgical History: Coronary artery bypass graft Total knee replacement colon ca w/radiation and surgical removement of part of colon  Social History: Wife, Okey Regal passed away in 24-Sep-2009.  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  Continues to drink 1-2 beers daily.  Retired Conservation officer, nature  Physical Exam  General:  VS reviewed, alert, well-developed, and well-nourished.   Lungs:  Normal respiratory effort, chest expands symmetrically. Lungs are clear to auscultation, no crackles or wheezes. Heart:  Normal rate and regular rhythm. S1 and S2 normal without gallop, murmur, click, rub or other extra sounds. Abdomen:  soft, non-tender, and normal bowel sounds.     Impression & Recommendations:  Problem # 1:  CONSTIPATION (ICD-564.00)  His updated medication list for this problem includes:    Senna 187 Mg Tabs (Senna) .Marland Kitchen... Take 1 dialy for constipation as needed    Colace 100 Mg Caps (Docusate sodium) .Marland Kitchen... Take 1 pill by mouth bid    Lactulose 20  Gm/61ml Soln (Lactulose) .Marland Kitchen... Take 30ml by mouth daily as needed for constipation  Orders: [facility]- Est  Level 4 (99214)  Problem # 2:  BENIGN PROSTATIC HYPERTROPHY, HX OF (ICD-V13.8) stable Orders: [facility]- Est  Level 4 (16109)  Problem # 3:  CORONARY ARTERY DISEASE (ICD-414.00) continue current meds His updated medication list for this problem includes:    Terazosin Hcl 5 Mg Caps (Terazosin hcl) .Marland Kitchen... 1 tab by mouth once daily    Tenormin 25 Mg Tabs (Atenolol) .Marland Kitchen... 1 tab daily    Aspirin 81 Mg Tabs (Aspirin) .Marland Kitchen... 1 tab by mouth once daily  Problem # 4:  MOURNING (ICD-309.0) Assessment: Improved Wife passed away >6 months ago.  doing well  Complete Medication List: 1)  Terazosin Hcl 5 Mg Caps (Terazosin hcl) .Marland Kitchen.. 1 tab by mouth once daily 2)  Simvastatin 80 Mg Tabs (Simvastatin) .Marland Kitchen.. 1 tab by mouth once daily 3)  Meclizine Hcl 25 Mg Tabs (Meclizine hcl) .... As needed 4)  Fish Oil Oil (Fish oil) .Marland Kitchen.. 1 tab by mouth once daily 5)  Omega-3 350 Mg Caps (Omega-3 fatty acids) .Marland Kitchen.. 1 tab by mouth once daily 6)  Centrum Silver Tabs (Multiple vitamins-minerals) .Marland Kitchen.. 1 tab by mouth once daily 7)  Vitamin E 400 Unit Caps (Vitamin e) .Marland Kitchen.. 1 tab by mouth once daily 8)  Vitamin D3 2000 Unit Caps (Cholecalciferol) .Marland Kitchen.. 1 tab by mouth once daily 9)  Vitamin B-12 1000 Mcg Tabs (Cyanocobalamin) .Marland Kitchen.. 1 tab by mouth once daily 10)  Cinnamon 500 Mg Caps (Cinnamon) .Marland Kitchen.. 1 tab by mouth once daily 11)  Tenormin 25 Mg Tabs (Atenolol) .Marland Kitchen.. 1 tab daily 12)  Aspirin 81 Mg Tabs (Aspirin) .Marland Kitchen.. 1 tab by mouth once daily 13)  Coq10 50 Mg Caps (Coenzyme q10) .Marland Kitchen.. 1 tab by mouth once daily 14)  Senna 187 Mg Tabs (Senna) .... Take 1 dialy for constipation as needed 15)  Colace 100 Mg Caps (Docusate sodium) .... Take 1 pill by mouth bid 16)  Lactulose 20 Gm/14ml Soln (Lactulose) .... Take 30ml by mouth daily as needed for constipation 17)  Finasteride 5 Mg Tabs (Finasteride) .... Take 1 pill by mouth daily  Other  Orders: Colonoscopy (Colon)  Patient Instructions: 1)  Nice to see you today! 2)  Please stop taking Terazosin and start taking Finasteride for your urinary symptoms.   3)  Take colace for constipation. 4)  I have filled out the forms for you to get your handicap sticker. 5)  Follow up in 3-6 months. Prescriptions: FINASTERIDE 5 MG TABS (FINASTERIDE) take 1 pill by mouth daily  #30 x 3   Entered and Authorized by:   [name] DO  Signed by:   [name] DO on 02/23/2010   Method used:   [facility]        [facility] [address] [phone]* (retail)       [address]       [place], [place]  [address]       Ph: [phone] or [phone]       Fax: [phone]   RxID:   [id number] LACTULOSE 20 GM/30ML SOLN (LACTULOSE) take 30mL by mouth daily as needed for constipation  #319mL x 1   Entered and Authorized by:   [name] DO   Signed by:   [name] DO on 02/23/2010   Method used:   [facility]        [facility] [address] [phone]* (retail)       [address]       [place], [place]  [address]       Ph: [phone] or [phone]       Fax: [phone]   RxID:   [id number] COLACE 100 MG CAPS (DOCUSATE SODIUM) take 1 pill by mouth BID  #60 x 3   Entered and Authorized by:   [name] DO   Signed by:   [name] DO on 02/23/2010   Method used:   [facility]        [facility] [address] [phone]* (retail)       [address]       [place], [place]  [address]       Ph: [phone] or [phone]       Fax: [phone]   RxID:   [id number] MECLIZINE HCL 25 MG TABS (MECLIZINE HCL) as needed  #30 x 11   Entered and Authorized by:   [name] DO   Signed by:   [name] DO on 02/23/2010   Method used:   [facility]        [facility]  [address] [phone]* (retail)       [address]       [place], [place]  [address]       Ph: [phone] or [phone]       Fax: [phone]   RxID:    [id number]    Prevention & Chronic Care Immunizations   Influenza vaccine: Not documented    Tetanus booster: Not documented    Pneumococcal vaccine: Not documented    H. zoster vaccine: Not documented  Colorectal Screening   Hemoccult: Not documented    Colonoscopy: Not documented   Colonoscopy action/deferral: GI Referral  (02/23/2010)  Other Screening   PSA: 0.50  (11/10/2009)  Reports requested:   Last colonoscopy report requested.  Smoking status: never  (02/23/2010)  Lipids   Total Cholesterol: 130  (11/10/2009)   Lipid panel action/deferral: Not indicated   LDL: 47  (11/10/2009)   LDL Direct: Not documented   HDL: 43  (11/10/2009)   Triglycerides: 202  (11/10/2009)   Lipid panel due: 08/25/2010    SGOT (AST): 14  (11/10/2009)   BMP action: Not indicated   SGPT (ALT): 11  (11/10/2009)   Alkaline phosphatase: 64  (11/10/2009)   Total bilirubin: 0.9  (11/10/2009)    Lipid flowsheet reviewed?: Yes   Progress toward LDL goal: At goal  Hypertension   Last Blood Pressure: 123 / 70  (02/23/2010)   Serum creatinine: 1.15  (11/10/2009)   BMP action: Not indicated   Serum potassium 4.0  (11/10/2009)   Basic  metabolic panel due: 08/25/2010    Hypertension flowsheet reviewed?: Yes   Progress toward BP goal: At goal  Self-Management Support :   Personal Goals (by the next clinic visit) :      Personal blood pressure goal: 130/80  (02/23/2010)     Personal LDL goal: 70  (02/23/2010)    Patient will work on the following items until the next clinic visit to reach self-care goals:     Medications and monitoring: take my medicines every day  (02/23/2010)     Eating: drink diet soda or water instead of juice or soda, eat more vegetables  (02/23/2010)     Activity: take a 30 minute walk every day, take the stairs instead of the elevator  (02/23/2010)    Hypertension self-management support: Not documented    Lipid self-management support: Not documented     Nursing Instructions: Screening colonoscopy ordered Request report of last colonoscopy

## 2010-10-14 NOTE — Assessment & Plan Note (Signed)
Summary: BLOOD IN STOOL;FAM HX COL CANCER.Jacob Norton.   History of Present Illness Visit Type: Initial Consult Primary GI MD: Melvia Heaps MD [facility] Primary Provider: Carney Living, MD  Requesting Provider: Carney Living, MD  Chief Complaint: Constipation, and rectal bleeding 2 times in the past six months  History of Present Illness:   Jacob Norton is a pleasant 76 year old white male with a history of colon cancer referred at the request of Dr. Deirdre Priest for evaluation of rectal bleeding.  On several occasions he seems minimal amounts of bright red blood per rectum associated with constipation.  He denies abdominal or rectal pain.  He status post partial colon resection for a carcinoma 10-15 years ago.  He is also received radiation therapy for prostate cancer.  There is no history of melena.   GI Review of Systems      Denies abdominal pain, acid reflux, belching, bloating, chest pain, dysphagia with liquids, dysphagia with solids, heartburn, loss of appetite, nausea, vomiting, vomiting blood, weight loss, and  weight gain.      Reports constipation and  rectal bleeding.     Denies anal fissure, black tarry stools, change in bowel habit, diarrhea, diverticulosis, fecal incontinence, heme positive stool, hemorrhoids, irritable bowel syndrome, jaundice, light color stool, liver problems, and  rectal pain.    Current Medications (verified): 1)  Terazosin Hcl 5 Mg Caps (Terazosin Hcl) .Marland Kitchen.. 1 Tab By Mouth Once Daily 2)  Meclizine Hcl 25 Mg Tabs (Meclizine Hcl) .... As Needed 3)  Fish Oil   Oil (Fish Oil) .Marland Kitchen.. 1 Tab By Mouth Once Daily 4)  Omega-3 350 Mg Caps (Omega-3 Fatty Acids) .Marland Kitchen.. 1 Tab By Mouth Once Daily 5)  Centrum Silver  Tabs (Multiple Vitamins-Minerals) .Marland Kitchen.. 1 Tab By Mouth Once Daily 6)  Vitamin E 400 Unit Caps (Vitamin E) .Marland Kitchen.. 1 Tab By Mouth Once Daily 7)  Vitamin D3 2000 Unit Caps (Cholecalciferol) .Marland Kitchen.. 1 Tab By Mouth Once Daily 8)  Vitamin B-12 1000 Mcg Tabs (Cyanocobalamin)  .Marland Kitchen.. 1 Tab By Mouth Once Daily 9)  Cinnamon 500 Mg Caps (Cinnamon) .Marland Kitchen.. 1 Tab By Mouth Once Daily 10)  Tenormin 25 Mg Tabs (Atenolol) .Marland Kitchen.. 1 Tab Daily 11)  Aspirin 81 Mg  Tabs (Aspirin) .Marland Kitchen.. 1 Tab By Mouth Once Daily 12)  Coq10 50 Mg Caps (Coenzyme Q10) .Marland Kitchen.. 1 Tab By Mouth Once Daily 13)  Mineral Oil  Oil (Mineral Oil) .... Three Times A Day 14)  Miralax  Powd (Polyethylene Glycol 3350) .... As Needed 15)  Simvastatin 40 Mg Tabs (Simvastatin) .Marland Kitchen.. 1 Daily 16)  Florastor 250 Mg Caps (Saccharomyces Boulardii) .... One Tablet By Mouth Two Times A Day  Allergies (verified): No Known Drug Allergies  Past History:  Past Medical History: Coronary artery disease s/p CABG SKIN TAG (ICD-701.9) RECTAL BLEEDING (ICD-569.3) MUSCLE PAIN (ICD-729.1) COLON CANCER, HX OF (ICD-V10.05) CONSTIPATION (ICD-564.00) BENIGN PROSTATIC HYPERTROPHY, HX OF (ICD-V13.8) MOURNING (ICD-309.0) HYPERLIPIDEMIA (ICD-272.4) OSTEOARTHRITIS (ICD-715.90) PROSTATE CANCER, HX OF (ICD-V10.46) CORONARY ARTERY DISEASE (ICD-414.00) PERSONAL HISTORY MALIGNANT NEOPLASM PROSTATE (ICD-V10.46) HYPERCHOLESTEROLEMIA (ICD-272.0) HYPERTENSION (ICD-401.9) CORONARY ARTERY BYPASS GRAFT, HX OF (ICD-V45.81)  Past Surgical History: Coronary artery bypass graft Total knee replacement Colon ca w/radiation and surgical removement of part of colon Angioplasty/Stent   Family History: non-contributory Family History of Prostate Cancer:Brother   Social History: Wife, Okey Regal passed away in 2009-10-07.  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  Continues to drink 1-2 beers daily.  Retired Conservation officer, nature Daily Caffeine Use: 2 daily   Review of Systems       The patient complains of back pain, hearing problems, and urination - excessive.  The patient denies allergy/sinus, anemia, anxiety-new, arthritis/joint pain,  blood in urine, breast changes/lumps, change in vision, confusion, cough, coughing up blood, depression-new, fainting, fatigue, fever, headaches-new, heart murmur, heart rhythm changes, itching, menstrual pain, muscle pains/cramps, night sweats, nosebleeds, pregnancy symptoms, shortness of breath, skin rash, sleeping problems, sore throat, swelling of feet/legs, swollen lymph glands, thirst - excessive , urination - excessive , urination changes/pain, urine leakage, vision changes, and voice change.         All other systems were reviewed and were negative   Vital Signs:  Patient profile:   76 year old male Height:      68 inches Weight:      197 pounds BMI:     30.06 BSA:     2.03 Pulse rate:   88 / minute Pulse rhythm:   regular BP sitting:   128 / 80  (left arm) Cuff size:   regular  Vitals Entered By: Ok Anis CMA (September 23, 2010 9:35 AM)  Physical Exam  Additional Exam:  On physical exam he is a heavyset male  skin: anicteric HEENT: normocephalic; PEERLA; no nasal or pharyngeal abnormalities neck: supple nodes: no cervical lymphadenopathy chest: clear to ausculatation and percussion heart: no murmurs, gallops, or rubs abd: soft, nontender; BS normoactive; no abdominal masses, tenderness, organomegaly; there is a reducible incisional hernia in the midepigastrium rectal: deferred ext: no cynanosis, clubbing, edema skeletal: no deformities neuro: oriented x 3; no focal abnormalities    Impression & Recommendations:  Problem # 1:  RECTAL BLEEDING (ICD-569.3)  Rectal bleeding is most likely secondary to hemorrhoids.  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  A more proximal colonic source should nevertheless be ruled out.  Recommendations #1 colonoscopy  Risks, alternatives, and complications of the procedure, including bleeding, perforation, and possible need for surgery, were explained to the patient.  Patient's questions were answered.  Orders: Colonoscopy  (Colon)  Problem # 2:  COLON CANCER, HX OF (ICD-V10.05)  Patient should undergo colonoscopy every 5 years  Orders: Colonoscopy (Colon)  Problem # 3:  HERNIA, INCISIONAL (ICD-553.21) Patient is asymptomatic.  Problem # 4:  PROSTATE CANCER, HX OF (ICD-V10.46) Assessment: Comment Only  Problem # 5:  CORONARY ARTERY BYPASS GRAFT, HX OF (ICD-V45.81) Assessment: Comment Only  Patient Instructions: 1)  Copy sent to : Carney Living, MD  2)  Your Colonoscopy is scheduled on 09/24/2010 at 8am 3)  You can pick up your MoviPrep from your pharmacy today 4)  Colonoscopy and Flexible Sigmoidoscopy brochure given.  5)  Conscious Sedation brochure given.  6)  The medication list was reviewed and reconciled.  All changed / newly prescribed medications were explained.  A complete medication list was provided to the patient / caregiver. Prescriptions: MOVIPREP 100 GM  SOLR (PEG-KCL-NACL-NASULF-NA ASC-C) As per prep instructions.  #1 x 0   Entered by:   Merri Ray CMA (AAMA)   Authorized by:   Louis Meckel MD   Signed by:   Merri Ray CMA (AAMA) on 09/23/2010   Method used:   Electronically to        [facility]  [address] [phone] (retail)       [address]  [place], [place]  [address]       Ph: [phone] or [phone]       Fax: [phone]   RxID:   [id number]

## 2010-10-14 NOTE — Letter (Signed)
Summary: Patient Notice- Polyp Results  [facility]  [address] [place], [place] [address]   Phone: [phone]  Fax: [phone]        September 28, 2010 MRN: [id number]    [name] [address] [place], [place]  [address]    Dear Mr. Jacob Norton,  I am pleased to inform you that the colon polyp(s) removed during your recent colonoscopy was (were) found to be benign (no cancer detected) upon pathologic examination.  I recommend you have a repeat colonoscopy examination in 5_ years to look for recurrent polyps, as having colon polyps increases your risk for having recurrent polyps or even colon cancer in the future.  Should you develop new or worsening symptoms of abdominal pain, bowel habit changes or bleeding from the rectum or bowels, please schedule an evaluation with either your primary care physician or with me.  Additional information/recommendations:  __ No further action with gastroenterology is needed at this time. Please      follow-up with your primary care physician for your other healthcare      needs.  __ Please call [phone] to schedule a return visit to review your      situation.  __ Please keep your follow-up visit as already scheduled.  _x_ Continue treatment plan as outlined the day of your exam.  Please call us if you are having persistent problems or have questions about your condition that have not been fully answered at this time.  Sincerely,  Louis Meckel MD  This letter has been electronically signed by your physician.  Appended Document: Patient Notice- Polyp Results Letter mailed

## 2010-10-14 NOTE — Procedures (Signed)
Summary: Colonoscopy  Patient: Jacob Norton Note: All result statuses are Final unless otherwise noted.  Tests: (1) Colonoscopy (COL)   COL Colonoscopy           DONE     [facility]     [address]     [place], [place]  [address]           COLONOSCOPY PROCEDURE REPORT           PATIENT:  [name], [name]  MR#:  [id number]     BIRTHDATE:  Jun 29, 1935, 75 yrs. old  GENDER:  male           ENDOSCOPIST:  Barbette Hair. Arlyce Dice, MD     Referred by:  Estill Batten. Deirdre Priest, M.D.           PROCEDURE DATE:  09/24/2010     PROCEDURE:  Colonoscopy with snare polypectomy     ASA CLASS:  Class II     INDICATIONS:  1) rectal bleeding  2) history of colon cancer  3)     h/o prostate Ca           MEDICATIONS:   Fentanyl 75 mcg IV, Versed 6 mg IV           DESCRIPTION OF PROCEDURE:   After the risks benefits and     alternatives of the procedure were thoroughly explained, informed     consent was obtained.  Digital rectal exam was performed and     revealed no abnormalities.   The LB 180AL [id number] endoscope was     introduced through the anus and advanced to the anastomosis,     without limitations.  The quality of the prep was excellent, using     MoviPrep.  The instrument was then slowly withdrawn as the colon     was fully examined.     <<PROCEDUREIMAGES>>           FINDINGS:  A sessile polyp was found in the distal transverse     colon. It was 3 mm in size. Polyp was snared without cautery.     Retrieval was successful (see image4). snare polyp  Mild     diverticulosis was found in the sigmoid colon (see image8).  There     were mucosal changes consistent with radiation proctitis seen in     the rectum (see image11).  This was otherwise a normal examination     of the colon (see image2, image5, image6, and image9).     Retroflexed views in the rectum revealed proctitis.    The time to     anastamosis =  3.50  minutes. The scope was then withdrawn (time =     9.50  min) from the patient  and the procedure completed.           COMPLICATIONS:  None           ENDOSCOPIC IMPRESSION:     1) 3 mm sessile polyp in the distal transverse colon     2) Mild diverticulosis in the sigmoid colon     3) Radiation proctitis     4) Otherwise normal examination           Limited rectal bleeding secondary to XRT proctitis           RECOMMENDATIONS:     1) Colonoscopy 5 years           REPEAT EXAM:   5 year(s) Colonoscopy  ______________________________     Barbette Hair Arlyce Dice, MD           CC:           n.     eSIGNED:   Barbette Hair. Masaki Rothbauer at 09/24/2010 08:48 AM           Jearld Pies, [phone]  Note: An exclamation mark (!) indicates a result that was not dispersed into the flowsheet. Document Creation Date: 09/24/2010 8:48 AM _______________________________________________________________________  (1) Order result status: Final Collection or observation date-time: 09/24/2010 08:39 Requested date-time:  Receipt date-time:  Reported date-time:  Referring Physician:   Ordering Physician: Melvia Heaps [phone]) Specimen Source:  Source: Launa Grill Order Number: [id number] Lab site:   Appended Document: Colonoscopy     Procedures Next Due Date:    Colonoscopy: 09/2015

## 2010-10-14 NOTE — Letter (Signed)
Summary: [facility] Instructions  [facility]  [address] [place], [place] [address]   Phone: [phone]  Fax: [phone]       Jacob Norton    [birth year]    MRN: [id number]        Procedure Day /Date:FRIDAY 09/24/2010     Arrival Time:7:30AM     Procedure Time:8:00AM     Location of Procedure:                    X   [facility] (4th Floor)   PREPARATION FOR COLONOSCOPY WITH MOVIPREP   Starting 5 days prior to your procedure 09/19/2010  do not eat nuts, seeds, popcorn, corn, beans, peas,  salads, or any raw vegetables.  Do not take any fiber supplements (e.g. Metamucil, Citrucel, and Benefiber).  THE DAY BEFORE YOUR PROCEDURE         DATE: 09/23/2010   OZD:[id number]  1.  Drink clear liquids the entire day-NO SOLID FOOD  2.  Do not drink anything colored red or purple.  Avoid juices with pulp.  No orange juice.  3.  Drink at least 64 oz. (8 glasses) of fluid/clear liquids during the day to prevent dehydration and help the prep work efficiently.  CLEAR LIQUIDS INCLUDE: Water Jello Ice Popsicles Tea (sugar ok, no milk/cream) Powdered fruit flavored drinks Coffee (sugar ok, no milk/cream) Gatorade Juice: apple, white grape, white cranberry  Lemonade Clear bullion, consomm, broth Carbonated beverages (any kind) Strained chicken noodle soup Hard Candy                             4.  In the morning, mix first dose of MoviPrep solution:    Empty 1 Pouch A and 1 Pouch B into the disposable container    Add lukewarm drinking water to the top line of the container. Mix to dissolve    Refrigerate (mixed solution should be used within 24 hrs)  5.  Begin drinking the prep at 5:00 p.m. The MoviPrep container is divided by 4 marks.   Every 15 minutes drink the solution down to the next mark (approximately 8 oz) until the full liter is complete.   6.  Follow completed prep with 16 oz of clear liquid of your choice (Nothing red or purple).  Continue  to drink clear liquids until bedtime.  7.  Before going to bed, mix second dose of MoviPrep solution:    Empty 1 Pouch A and 1 Pouch B into the disposable container    Add lukewarm drinking water to the top line of the container. Mix to dissolve    Refrigerate  THE DAY OF YOUR PROCEDURE      DATE: 09/24/2010 FRIDAY Beginning at3a.m. (5 hours before procedure):         1. Every 15 minutes, drink the solution down to the next mark (approx 8 oz) until the full liter is complete.  2. Follow completed prep with 16 oz. of clear liquid of your choice.    3. You may drink clear liquids until 6AM  (2 HOURS BEFORE PROCEDURE).   MEDICATION INSTRUCTIONS  Unless otherwise instructed, you should take regular prescription medications with a small sip of water   as early as possible the morning of your procedure.         OTHER INSTRUCTIONS  You will need a responsible adult at least 74 years of age to accompany you and  drive you home.   This person must remain in the waiting room during your procedure.  Wear loose fitting clothing that is easily removed.  Leave jewelry and other valuables at home.  However, you may wish to bring a book to read or  an iPod/MP3 player to listen to music as you wait for your procedure to start.  Remove all body piercing jewelry and leave at home.  Total time from sign-in until discharge is approximately 2-3 hours.  You should go home directly after your procedure and rest.  You can resume normal activities the  day after your procedure.  The day of your procedure you should not:   Drive   Make legal decisions   Operate machinery   Drink alcohol   Return to work  You will receive specific instructions about eating, activities and medications before you leave.    The above instructions have been reviewed and explained to me by   _______________________    I fully understand and can verbalize these instructions _____________________________  Date _________

## 2010-10-14 NOTE — Letter (Signed)
Summary: New Patient letter  [facility]  [address]   [place], [place] [address]   Phone: [phone]  Fax: [phone]       09/15/2010 MRN: [id number]  Jacob Norton [address] [place], [place]  [address]  Dear Mr. COPPA,  Welcome to the Gastroenterology Division at [facility].    You are scheduled to see Dr.  Arlyce Dice on 09/23/2010 at 10:00 on the 3rd floor at [facility], [address]  We ask that you try to arrive at our office 15 minutes prior to your appointment time to allow for check-in.  We would like you to complete the enclosed self-administered evaluation form prior to your visit and bring it with you on the day of your appointment.  We will review it with you.  Also, please bring a complete list of all your medications or, if you prefer, bring the medication bottles and we will list them.  Please bring your insurance card so that we may make a copy of it.  If your insurance requires a referral to see a specialist, please bring your referral form from your primary care physician.  Co-payments are due at the time of your visit and may be paid by cash, check or credit card.     Your office visit will consist of a consult with your physician (includes a physical exam), any laboratory testing he/she may order, scheduling of any necessary diagnostic testing (e.g. x-ray, ultrasound, CT-scan), and scheduling of a procedure (e.g. Endoscopy, Colonoscopy) if required.  Please allow enough time on your schedule to allow for any/all of these possibilities.    If you cannot keep your appointment, please call [phone] to cancel or reschedule prior to your appointment date.  This allows [place] the opportunity to schedule an appointment for another patient in need of care.  If you do not cancel or reschedule by 5 p.m. the business day prior to your appointment date, you will be charged a $50.00 late cancellation/no-show fee.    Thank you for choosing [facility] for your medical needs.  We appreciate the opportunity to care for you.  Please visit [place] at our website  to learn more about our practice.                     Sincerely,                                                             The Gastroenterology Division

## 2010-10-14 NOTE — Letter (Signed)
Summary: Generic Letter  [facility]  [address]   [place], [place] [address]   Phone: [phone]  Fax: [phone]     10/04/2010  [address] [place], [place]  [address]  Dear Mr. MASTRO,  We are happy to let you know that since you are covered under Medicare you are able to have a FREE visit at the [facility] to discuss your HEALTH. This is a new benefit for Medicare.  There will be no co-payment.  At this visit you will meet with Arlys John an expert in wellness and the health coach at our clinic.  At this visit we will discuss ways to keep you healthy and feeling well.  This visit will not replace your regular doctor visit and we cannot refill medications.     You will need to plan to be here at least one hour to talk about your medical history, your current status, review all of your medications, and discuss your future plans for your health.  This information will be entered into your record for your doctor to have and review.  If you are interested in staying healthy, this type of visit can help.  Please call the office at: [phone], to schedule a "Medicare Wellness Visit".  The day of the visit you should bring in all of your medications, including any vitamins, herbs, over the counter products you take.  Make a list of all the other doctors that you see, so we know who they are. If you have any other health documents please bring them.  We look forward to helping you stay healthy.  Sincerely,   [facility]  iAWV

## 2010-10-14 NOTE — Assessment & Plan Note (Signed)
Summary: SKIN TAG REMOVAL PER CHAMBLISS/KH   Vital Signs:  Patient profile:   76 year old male Height:      68 inches Weight:      194 pounds Temp:     98.7 degrees F oral Pulse rate:   69 / minute Pulse rhythm:   regular BP sitting:   119 / 79  (right arm) Cuff size:   regular  Vitals Entered By: Loralee Pacas CMA (October 07, 2010 9:46 AM) CC: skin tag removal   Primary Care Provider:  Carney Living, MD   CC:  skin tag removal.  History of Present Illness: enlarging skin tag right thigh. no bleeding or pain  Habits & Providers  Alcohol-Tobacco-Diet     Tobacco Status: never     Year Quit: 1990  Exercise-Depression-Behavior     Have you felt down or hopeless? no     Have you felt little pleasure in things? no     Depression Counseling: not indicated; screening negative for depression     Seat Belt Use: always  Current Medications (verified): 1)  Terazosin Hcl 5 Mg Caps (Terazosin Hcl) .Marland Kitchen.. 1 Tab By Mouth Once Daily 2)  Meclizine Hcl 25 Mg Tabs (Meclizine Hcl) .... As Needed 3)  Fish Oil   Oil (Fish Oil) .Marland Kitchen.. 1 Tab By Mouth Once Daily 4)  Omega-3 350 Mg Caps (Omega-3 Fatty Acids) .Marland Kitchen.. 1 Tab By Mouth Once Daily 5)  Centrum Silver  Tabs (Multiple Vitamins-Minerals) .Marland Kitchen.. 1 Tab By Mouth Once Daily 6)  Vitamin E 400 Unit Caps (Vitamin E) .Marland Kitchen.. 1 Tab By Mouth Once Daily 7)  Vitamin D3 2000 Unit Caps (Cholecalciferol) .Marland Kitchen.. 1 Tab By Mouth Once Daily 8)  Vitamin B-12 1000 Mcg Tabs (Cyanocobalamin) .Marland Kitchen.. 1 Tab By Mouth Once Daily 9)  Cinnamon 500 Mg Caps (Cinnamon) .Marland Kitchen.. 1 Tab By Mouth Once Daily 10)  Tenormin 25 Mg Tabs (Atenolol) .Marland Kitchen.. 1 Tab Daily 11)  Aspirin 81 Mg  Tabs (Aspirin) .Marland Kitchen.. 1 Tab By Mouth Once Daily 12)  Coq10 50 Mg Caps (Coenzyme Q10) .Marland Kitchen.. 1 Tab By Mouth Once Daily 13)  Mineral Oil  Oil (Mineral Oil) .... Three Times A Day 14)  Miralax  Powd (Polyethylene Glycol 3350) .... As Needed 15)  Simvastatin 40 Mg Tabs (Simvastatin) .Marland Kitchen.. 1 Daily 16)  Florastor 250  Mg Caps (Saccharomyces Boulardii) .... One Tablet By Mouth Two Times A Day 17)  Moviprep 100 Gm  Solr (Peg-Kcl-Nacl-Nasulf-Na Asc-C) .... As Per Prep Instructions. 18)  Viagra 100 Mg Tabs (Sildenafil Citrate) .... 1/2-1 Tablet As Directed  Allergies: No Known Drug Allergies  Social History: Risk analyst Use:  always  Review of Systems  The patient denies fever.    Physical Exam  Msk:  right thigh pedunculated skin tag. Additional Exam:  Patient given informed consent for emoval skin tag. Discussed possible complications of infection, bleeding Appropriate verbal time out taken Are cleaned and prepped in usual sterile fashion.1---cc 1% lidocaine without epinephrine was injected into the---aea for local anasthesia Skin tag base  crushed with hemmostats, snipped off and cautery used for local hemostasis.. Patient tolerated procedure well with no complications.    Complete Medication List: 1)  Terazosin Hcl 5 Mg Caps (Terazosin hcl) .Marland Kitchen.. 1 tab by mouth once daily 2)  Meclizine Hcl 25 Mg Tabs (Meclizine hcl) .... As needed 3)  Fish Oil Oil (Fish oil) .Marland Kitchen.. 1 tab by mouth once daily 4)  Omega-3 350 Mg Caps (Omega-3 fatty acids) .Marland KitchenMarland KitchenMarland Kitchen 1  tab by mouth once daily 5)  Centrum Silver Tabs (Multiple vitamins-minerals) .Marland Kitchen.. 1 tab by mouth once daily 6)  Vitamin E 400 Unit Caps (Vitamin e) .Marland Kitchen.. 1 tab by mouth once daily 7)  Vitamin D3 2000 Unit Caps (Cholecalciferol) .Marland Kitchen.. 1 tab by mouth once daily 8)  Vitamin B-12 1000 Mcg Tabs (Cyanocobalamin) .Marland Kitchen.. 1 tab by mouth once daily 9)  Cinnamon 500 Mg Caps (Cinnamon) .Marland Kitchen.. 1 tab by mouth once daily 10)  Tenormin 25 Mg Tabs (Atenolol) .Marland Kitchen.. 1 tab daily 11)  Aspirin 81 Mg Tabs (Aspirin) .Marland Kitchen.. 1 tab by mouth once daily 12)  Coq10 50 Mg Caps (Coenzyme q10) .Marland Kitchen.. 1 tab by mouth once daily 13)  Mineral Oil Oil (Mineral oil) .... Three times a day 14)  Miralax Powd (Polyethylene glycol 3350) .... As needed 15)  Simvastatin 40 Mg Tabs (Simvastatin) .Marland Kitchen.. 1 daily 16)   Florastor 250 Mg Caps (Saccharomyces boulardii) .... One tablet by mouth two times a day 17)  Moviprep 100 Gm Solr (Peg-kcl-nacl-nasulf-na asc-c) .... As per prep instructions. 18)  Viagra 100 Mg Tabs (Sildenafil citrate) .... 1/2-1 tablet as directed  Other Orders: Skin Tags (up to 15) - [facility] (11200)   Orders Added: 1)  Skin Tags (up to 15) - [facility] [11200]

## 2010-10-14 NOTE — Miscellaneous (Signed)
Summary: Zostavax given  received notice from [facility] that patient received Zostavax 09/17/2010.  Theresia Lo RN  September 29, 2010 2:02 PM   Other Immunization History:    Zostavax # 1:  Zostavax (09/17/2010)

## 2010-10-14 NOTE — Assessment & Plan Note (Signed)
Summary: skin tag removal/   Vital Signs:  Patient profile:   76 year old male Height:      68 inches Weight:      197 pounds BMI:     30.06 BSA:     2.03 Temp:     98.3 degrees F Pulse rate:   80 / minute BP sitting:   134 / 78  Vitals Entered By: Jone Baseman CMA (September 15, 2010 11:06 AM) CC: skin tag removal Is Patient Diabetic? No Pain Assessment Patient in pain? no        Primary Care Provider:  [name] DO  CC:  skin tag removal.  History of Present Illness: Skin tag has had for several years on R thigh.  Is sometimes irritated and catches on clothes.   Has had numerous others.  No redness or discharge   Blood in Stool None since last visit but has had BRP several times over the last few months.  His bowel movements are now more regular and consistent using several over the counter bowel preparations.    Has history of colon cancer status post  resection.     He does not have any chest pain or lightheadedness   ROS - as above PMH - Medications reviewed and updated in medication list.  Smoking Status noted in VS form    Habits & Providers  Alcohol-Tobacco-Diet     Tobacco Status: never     Year Quit: 1990  Current Medications (verified): 1)  Terazosin Hcl 5 Mg Caps (Terazosin Hcl) .Marland Kitchen.. 1 Tab By Mouth Once Daily 2)  Meclizine Hcl 25 Mg Tabs (Meclizine Hcl) .... As Needed 3)  Fish Oil   Oil (Fish Oil) .Marland Kitchen.. 1 Tab By Mouth Once Daily 4)  Omega-3 350 Mg Caps (Omega-3 Fatty Acids) .Marland Kitchen.. 1 Tab By Mouth Once Daily 5)  Centrum Silver  Tabs (Multiple Vitamins-Minerals) .Marland Kitchen.. 1 Tab By Mouth Once Daily 6)  Vitamin E 400 Unit Caps (Vitamin E) .Marland Kitchen.. 1 Tab By Mouth Once Daily 7)  Vitamin D3 2000 Unit Caps (Cholecalciferol) .Marland Kitchen.. 1 Tab By Mouth Once Daily 8)  Vitamin B-12 1000 Mcg Tabs (Cyanocobalamin) .Marland Kitchen.. 1 Tab By Mouth Once Daily 9)  Cinnamon 500 Mg Caps (Cinnamon) .Marland Kitchen.. 1 Tab By Mouth Once Daily 10)  Tenormin 25 Mg Tabs (Atenolol) .Marland Kitchen.. 1 Tab Daily 11)   Aspirin 81 Mg  Tabs (Aspirin) .Marland Kitchen.. 1 Tab By Mouth Once Daily 12)  Coq10 50 Mg Caps (Coenzyme Q10) .Marland Kitchen.. 1 Tab By Mouth Once Daily 13)  Senna 187 Mg Tabs (Senna) .... Take 1 Dialy For Constipation As Needed 14)  Mineral Oil  Oil (Mineral Oil) .... Three Times A Day 15)  Miralax  Powd (Polyethylene Glycol 3350) .... As Needed 16)  Simvastatin 40 Mg Tabs (Simvastatin) .Marland Kitchen.. 1 Daily  Allergies: No Known Drug Allergies  Physical Exam  General:  Well-developed,well-nourished,in no acute distress; alert,appropriate and cooperative throughout examination Skin:  R Thigh pedunculated skin tag with base approximately 4-5 mm and main mass 1.5-2 cm    Impression & Recommendations:  Problem # 1:  RECTAL BLEEDING (ICD-569.3) Given history of colon cancer needs colonscopy will refer to GI.  Will check CBC    Orders: CBC-[facility] (16109) Gastroenterology Referral (GI) [facility]- Est  Level 4 (60454)  Problem # 2:  SKIN TAG (ICD-701.9)  large with history of irritation.  May need suture will refer to Procedure Clinic  Orders: [facility]- Est  Level 4 (09811)  Complete Medication  List: 1)  Terazosin Hcl 5 Mg Caps (Terazosin hcl) .Marland Kitchen.. 1 tab by mouth once daily 2)  Meclizine Hcl 25 Mg Tabs (Meclizine hcl) .... As needed 3)  Fish Oil Oil (Fish oil) .Marland Kitchen.. 1 tab by mouth once daily 4)  Omega-3 350 Mg Caps (Omega-3 fatty acids) .Marland Kitchen.. 1 tab by mouth once daily 5)  Centrum Silver Tabs (Multiple vitamins-minerals) .Marland Kitchen.. 1 tab by mouth once daily 6)  Vitamin E 400 Unit Caps (Vitamin e) .Marland Kitchen.. 1 tab by mouth once daily 7)  Vitamin D3 2000 Unit Caps (Cholecalciferol) .Marland Kitchen.. 1 tab by mouth once daily 8)  Vitamin B-12 1000 Mcg Tabs (Cyanocobalamin) .Marland Kitchen.. 1 tab by mouth once daily 9)  Cinnamon 500 Mg Caps (Cinnamon) .Marland Kitchen.. 1 tab by mouth once daily 10)  Tenormin 25 Mg Tabs (Atenolol) .Marland Kitchen.. 1 tab daily 11)  Aspirin 81 Mg Tabs (Aspirin) .Marland Kitchen.. 1 tab by mouth once daily 12)  Coq10 50 Mg Caps (Coenzyme q10) .Marland Kitchen.. 1 tab by mouth once  daily 13)  Senna 187 Mg Tabs (Senna) .... Take 1 dialy for constipation as needed 14)  Mineral Oil Oil (Mineral oil) .... Three times a day 15)  Miralax Powd (Polyethylene glycol 3350) .... As needed 16)  Simvastatin 40 Mg Tabs (Simvastatin) .Marland Kitchen.. 1 daily  Patient Instructions: 1)  We will schedule you in our procedure clinic to remove the skin tag.  2)  We will get you an appointment to see [facility] to make sure the colon cancer has not recurred.   3)  Consider the Shingles shot to prvent shingles   Orders Added: 1)  CBC-[facility] [85027] 2)  Gastroenterology Referral [GI] 3)  [facility]- Est  Level 4 [04540]     Prevention & Chronic Care Immunizations   Influenza vaccine: Not documented    Tetanus booster: Not documented    Pneumococcal vaccine: Not documented    H. zoster vaccine: Not documented  Colorectal Screening   Hemoccult: Not documented    Colonoscopy: Not documented   Colonoscopy action/deferral: GI Referral  (02/23/2010)  Other Screening   PSA: 0.50  (11/10/2009)   Smoking status: never  (09/15/2010)  Lipids   Total Cholesterol: 130  (11/10/2009)   Lipid panel action/deferral: Not indicated   LDL: 47  (11/10/2009)   LDL Direct: Not documented   HDL: 43  (11/10/2009)   Triglycerides: 202  (11/10/2009)   Lipid panel due: 08/25/2010    SGOT (AST): 20  (06/25/2010)   BMP action: Not indicated   SGPT (ALT): 14  (06/25/2010)   Alkaline phosphatase: 70  (06/25/2010)   Total bilirubin: 1.1  (06/25/2010)  Hypertension   Last Blood Pressure: 134 / 78  (09/15/2010)   Serum creatinine: 1.08  (06/25/2010)   BMP action: Not indicated   Serum potassium 4.1  (06/25/2010)   Basic metabolic panel due: 08/25/2010  Self-Management Support :   Personal Goals (by the next clinic visit) :      Personal blood pressure goal: 130/80  (02/23/2010)     Personal LDL goal: 70  (02/23/2010)    Hypertension self-management support: Not documented    Lipid self-management  support: Not documented     Appended Document: skin tag removal/   Pneumovax Vaccine    Vaccine Type: Pneumovax    Site: left deltoid    Mfr: Merck    Dose: 0.5 ml    Route: IM    Given by: Garen Grams LPN    Exp. Date: 02/04/2012    Lot #:  [id number]    VIS given: 08/17/09 version given September 15, 2010.

## 2010-10-14 NOTE — Progress Notes (Signed)
Summary: Questions  Phone Note Call from Patient Call back at Home Phone [phone]   Caller: Patient Call For: Dr. Arlyce Dice Reason for Call: Talk to Nurse Summary of Call: Pt has some questions following procedure on friday Initial call taken by: [place] Johnson,  September 27, 2010 9:04 AM  Follow-up for Phone Call        RETURNED CALL AND SPOKE WITH PATIENT CONCERNING QUESTION ABOUT PROCEDURE. ANSWERED QUESTION AND CONCERNS. Follow-up by: Darlyn Read RN,  September 27, 2010 9:55 AM

## 2010-10-14 NOTE — Miscellaneous (Signed)
Summary: RX  Patient is wanting to stop taking Simvastatin and start back on Flomax.  He had heard about side effects.  He uses [facility] [address]. Bradly Bienenstock  October 04, 2010 3:52 PM  Discussed he will stikc with simvastatin Pearlean Brownie MD  October 05, 2010 3:51 PM

## 2010-10-29 ENCOUNTER — Encounter: Payer: Self-pay | Admitting: Home Health Services

## 2010-10-29 ENCOUNTER — Ambulatory Visit (INDEPENDENT_AMBULATORY_CARE_PROVIDER_SITE_OTHER): Payer: Medicare Other | Admitting: Home Health Services

## 2010-10-29 VITALS — BP 136/78 | Temp 97.8°F | Ht 68.0 in | Wt 194.0 lb

## 2010-10-29 DIAGNOSIS — Z0000 Encounter for general adult medical examination without abnormal findings: Secondary | ICD-10-CM

## 2010-10-29 NOTE — Progress Notes (Addendum)
Patient here for annual wellness visit, patient reports: Risk Factors/Conditions needing evaluation or treatment: Patient would like to schedule an ultrasound for aortic aneurism assessment.  Brother had aneurism and he is a former smoker.  Diet: Patient does not cook at home.  Diet lacks vegetables, fruits, and fiber. Physical Activity: Patient works out on cardiac glider 3 times a day and does 25 reps.  Home Safety: Lives by himself in his home.  His wife died a year ago. Patient has smoke detectors in his home and does not have any adaptive equipment. End of Life Plan: Patient reports that his will and medical wishes are established with his attorney and children. Prevention Plan: All Medicare covered prevention screenings have been done except or ultrasound test for aortic aneurysm. Patient identified Jacob Norton as his emergency contact at [phone]. Other Information: Patient reports smoking half a cigar once a month. Patient wears two hearing aids. Patient reports no falls but has a right dropped foot.    Balance max value patientvalue  Sitting balance 1 1  Arise 2 2  Attempts to arise 2 2  Immediate standing balance 2 2  Standing balance 1 1  Nudge 2 2  Eyes closed 1 1  360 degree turn 1 1  Sitting down 2 2   Gait max value patient value  Initiation of gait 1 1  Step length-left 1 1  Step length-right 1 0  Step height-left 1 1  Step height-right 1 0  Step symmetry 1 0  Step continuity 1 1  Path 2 2  Trunk 2 2  Walking stance 1 1   Balance/Gait Score:24 /26  Mental Status Exam max value patient value  Orientation to time 5 5  Orientation to place 5 5  Registration 3 3  Attention 5 5  Recall 3 3  Language (name 2 objects) 2 2  Language-repeat 1 1  Language-follow 3 step command 3 3  Language-read and follow directions 1 1   Mental Status Score: 28/28  Annual Wellness Visit Requirements Where Recorded  Medical, family, social history History  Current  providers Care team  Current medications Medications  Wt, BP, Ht, BMI Vital signs  Tobacco, alcohol, illicit drug use History  ADL Nurse Assessment  Depression Screening Nurse Assessment  Cognitive impairment Nurse Assessment  Fall Risk Nurse Assessment  Home Safety Progress Note  End of Life Planning (welcome visit) Progress Note  Medicare preventative servcies Progress Note  Risk factors/conditions needing evaluation/treatment Progress Note  Personalized health advice Patient Instructions, goals, letter   Medicare Prevention Screenings Men over 74 Test For Frequency Date of Last- BOLD if needed  Colorectal Cancer 1-10 yrs 2012  Prostate Cancer Never or yearly 2011  Aortic Aneurysm Once if 65-75 with hx of smoking To be scheduled  Cholesterol 5 yrs 2011  Diabetes yearly Non-diabetic  HIV yearly declined  Influenza Shot yearly 07/2010  Pneumonia Shot once 09/2010  Zostavax Shot once 09/2010    I have reviewed this visit and discussed with Jacob Norton

## 2010-10-29 NOTE — Patient Instructions (Addendum)
1. Increase fiber with whole foods like fruits, vegetables, and whole grains. 2. Increase cardio exercise to 30 minutes a day with a target heart rate of 120 beats per minute. 3. Take blood pressure every day and record it.  Follow up with Dr. Deirdre Priest in 2 months with blood pressure readings.

## 2010-11-01 ENCOUNTER — Other Ambulatory Visit: Payer: Self-pay | Admitting: Family Medicine

## 2010-11-01 DIAGNOSIS — Z136 Encounter for screening for cardiovascular disorders: Secondary | ICD-10-CM

## 2010-11-02 ENCOUNTER — Other Ambulatory Visit: Payer: Self-pay | Admitting: Family Medicine

## 2010-11-02 ENCOUNTER — Telehealth: Payer: Self-pay | Admitting: *Deleted

## 2010-11-02 DIAGNOSIS — Z136 Encounter for screening for cardiovascular disorders: Secondary | ICD-10-CM

## 2010-11-02 NOTE — Telephone Encounter (Signed)
Scheduler from [facility] hospital called and needs the order for this patient switched from abdominal U/S to aortic U/S.  Will forward to MD Quinta Eimer, Maryjo Rochester

## 2010-11-02 NOTE — Telephone Encounter (Signed)
MD changed the order.  Scheduler @ [facility] advised that as soon as the new order showed up in her queue she would call the patient and schedule it. Fleeger, Maryjo Rochester

## 2010-11-11 ENCOUNTER — Ambulatory Visit (HOSPITAL_COMMUNITY)
Admission: RE | Admit: 2010-11-11 | Discharge: 2010-11-11 | Disposition: A | Discharge status: Home / Self Care | Payer: Medicare Other | Source: Ambulatory Visit | Attending: Family Medicine | Admitting: Family Medicine

## 2010-11-11 DIAGNOSIS — Z136 Encounter for screening for cardiovascular disorders: Secondary | ICD-10-CM

## 2010-11-11 DIAGNOSIS — Q619 Cystic kidney disease, unspecified: Secondary | ICD-10-CM | POA: Insufficient documentation

## 2010-11-11 DIAGNOSIS — K8020 Calculus of gallbladder without cholecystitis without obstruction: Secondary | ICD-10-CM | POA: Insufficient documentation

## 2010-11-11 DIAGNOSIS — I700 Atherosclerosis of aorta: Secondary | ICD-10-CM | POA: Insufficient documentation

## 2010-11-12 ENCOUNTER — Encounter: Payer: Self-pay | Admitting: Family Medicine

## 2010-12-07 ENCOUNTER — Other Ambulatory Visit: Payer: Self-pay | Admitting: Family Medicine

## 2010-12-07 MED ORDER — TERAZOSIN HCL 5 MG PO CAPS: 5.0000 mg | ORAL_CAPSULE | Freq: Every day | ORAL | Status: DC

## 2010-12-14 ENCOUNTER — Ambulatory Visit (INDEPENDENT_AMBULATORY_CARE_PROVIDER_SITE_OTHER): Payer: Medicare Other | Admitting: Cardiovascular Disease

## 2010-12-14 ENCOUNTER — Encounter: Payer: Self-pay | Admitting: Cardiovascular Disease

## 2010-12-14 DIAGNOSIS — E782 Mixed hyperlipidemia: Secondary | ICD-10-CM | POA: Insufficient documentation

## 2010-12-14 DIAGNOSIS — I2510 Atherosclerotic heart disease of native coronary artery without angina pectoris: Secondary | ICD-10-CM

## 2010-12-14 DIAGNOSIS — I10 Essential (primary) hypertension: Secondary | ICD-10-CM

## 2010-12-14 NOTE — Patient Instructions (Signed)
Your physician recommends that you schedule a follow-up appointment in: 6 months with Dr. Nishan 

## 2010-12-14 NOTE — Progress Notes (Signed)
Jacob Norton is seen today for F/U.  He has distant history of CAD with CABG in the 80's.  he had a normal myovue in 4/10 and has normal EF.  He has had a rough 2011 with his son being killed and his wife dying a month  of chronic lung disease.  He denies SSCP, palpitations, dyspnea or syncope.  He still has 3 children living but gets bored at night.  He has been compliant with his meds and is sedantary except for walking  He is tolerating 40 mg of zocor with no leg pain  He has downsized and moved to a town home.    ROS: Denies fever, malais, weight loss, blurry vision, decreased visual acuity, cough, sputum, SOB, hemoptysis, pleuritic pain, palpitaitons, heartburn, abdominal pain, melena, lower extremity edema, claudication, or rash.   General: Affect appropriate Healthy:  appears stated age HEENT: normal Neck supple with no adenopathy JVP normal no bruits no thyromegaly Lungs clear with no wheezing and good diaphragmatic motion Heart:  S1/S2 no murmur,rub, gallop or click PMI normal Abdomen: benighn, BS positve, no tenderness, no AAA no bruit.  No HSM or HJR Ventral hernia Distal pulses intact with no bruits No edema Neuro non-focal Skin warm and dry Multiple tatoos No muscular weakness   Current Outpatient Prescriptions  Medication Sig Dispense Refill  . aspirin 81 MG tablet Take 81 mg by mouth daily.        Marland Kitchen atenolol (TENORMIN) 25 MG tablet Take 25 mg by mouth daily.        . Fish Oil OIL daily.        Marland Kitchen HYDROcodone-acetaminophen (VICODIN) 5-500 MG per tablet Take 1 tablet by mouth every 6 (six) hours as needed.        . meclizine (ANTIVERT) 25 MG tablet Take 25 mg by mouth as needed.        . mineral oil liquid Take 30 mLs by mouth 2 (two) times daily.        . Multiple Vitamins-Minerals (CENTRUM SILVER PO) Take 1 tablet by mouth daily.        . simvastatin (ZOCOR) 80 MG tablet 1/2 TAB PO QD       . terazosin (HYTRIN) 5 MG capsule Take 1 capsule (5 mg total) by mouth daily.  30  capsule  3  . vitamin E 400 UNIT capsule Take 400 Units by mouth daily.          Allergies  Review of patient's allergies indicates no known allergies.  Electrocardiogram:  NSR no actue changes normal ECG  Assessment and Plan

## 2010-12-15 NOTE — Assessment & Plan Note (Signed)
Stable with no angina and good activity level.  Continue medical Rx  

## 2010-12-15 NOTE — Assessment & Plan Note (Signed)
Well controlled.  Continue current medications and low sodium Dash type diet.    

## 2011-01-14 ENCOUNTER — Other Ambulatory Visit: Payer: Self-pay | Admitting: *Deleted

## 2011-01-14 MED ORDER — ATENOLOL 25 MG PO TABS: 25.0000 mg | ORAL_TABLET | Freq: Every day | ORAL | Status: DC

## 2011-01-28 ENCOUNTER — Telehealth: Payer: Self-pay | Admitting: Family Medicine

## 2011-01-28 NOTE — Telephone Encounter (Signed)
Jacob Norton needs prescription for Hydrocone-Acetaminophen 5-500.  He dropped off the empty bottle with [name] and said dr may need to see it.

## 2011-01-31 MED ORDER — HYDROCODONE-ACETAMINOPHEN 5-500 MG PO TABS: 1.0000 | ORAL_TABLET | Freq: Four times a day (QID) | ORAL | Status: DC | PRN

## 2011-01-31 NOTE — Telephone Encounter (Signed)
Rx called, patient informed.

## 2011-02-02 ENCOUNTER — Ambulatory Visit (INDEPENDENT_AMBULATORY_CARE_PROVIDER_SITE_OTHER): Payer: Medicare Other | Admitting: Family Medicine

## 2011-02-02 ENCOUNTER — Encounter: Payer: Self-pay | Admitting: Family Medicine

## 2011-02-02 VITALS — BP 141/71 | HR 69 | Temp 98.0°F | Ht 65.0 in | Wt 195.0 lb

## 2011-02-02 DIAGNOSIS — R5383 Other fatigue: Secondary | ICD-10-CM

## 2011-02-02 LAB — COMPREHENSIVE METABOLIC PANEL
AST: 21 U/L (ref 0–37)
Albumin: 4.4 g/dL (ref 3.5–5.2)
BUN: 19 mg/dL (ref 6–23)
Calcium: 9.5 mg/dL (ref 8.4–10.5)
Chloride: 104 mEq/L (ref 96–112)
Creat: 1.24 mg/dL (ref 0.40–1.50)
Glucose, Bld: 102 mg/dL — ABNORMAL HIGH (ref 70–99)
Potassium: 4 mEq/L (ref 3.5–5.3)

## 2011-02-02 LAB — CBC WITH DIFFERENTIAL/PLATELET
Basophils Absolute: 0 10*3/uL (ref 0.0–0.1)
Eosinophils Relative: 2 % (ref 0–5)
HCT: 41.4 % (ref 39.0–52.0)
Hemoglobin: 14.1 g/dL (ref 13.0–17.0)
Lymphocytes Relative: 8 % — ABNORMAL LOW (ref 12–46)
MCHC: 34.1 g/dL (ref 30.0–36.0)
MCV: 86.3 fL (ref 78.0–100.0)
Monocytes Absolute: 0.8 10*3/uL (ref 0.1–1.0)
Monocytes Relative: 9 % (ref 3–12)
RDW: 13.1 % (ref 11.5–15.5)
WBC: 8.7 10*3/uL (ref 4.0–10.5)

## 2011-02-02 NOTE — Patient Instructions (Signed)
If your stomach pain gets worse then call us or if you notice any bleeding  Call if your fatigue/sleepiness is getting a lot worse.  I will call you if your tests are not good.  Otherwise I will send you a letter.  If you do not hear from me with in 2 weeks please call our office.

## 2011-02-02 NOTE — Progress Notes (Signed)
  Subjective:    Patient ID: Jacob Norton, male    DOB: 06-Jan-1935, 76 y.o.   MRN: [id number]  HPI  FATIGUE  Onset: over the last few weeks/months  Fatigue with exertion: some although major symptom is he feels he needs to nap more often.  Does not fall asleep duirng driving or other activities however  Physical limitations: can't walk quite as far. No shortness of breath or chest pain with exertino  Symptoms Fever: no  Night sweats: no  Weight loss: no   Exertional chest pain: no  Dyspnea: no  Cough: no  Hemoptysis: no  New medications: no  Leg swelling: no  Orthopnea: no  PND: no  Melena: no  Adenopathy: no  Severe snoring: yes  Daytime sleepiness: no  Skin changes: no  Feeling depressed: no  Anhedonia: no  Altered appetite: no  Poor sleep: no  Abdominal Pain  FYI Started last pm is much better now.  Epigastric crampy feeling.  No nausea or vomiting or diarrhea.  Took pepto which helped.     Review of Symptoms - see HPI  PMH - Smoking status noted.  Recently married and his new wife moved to be with him in [place] a few weeks ago    Review of Systems     Objective:   Physical Exam    Neck:  No deformities, thyromegaly, masses, or tenderness noted.   Supple with full range of motion without pain. Heart - Regular rate and rhythm.  No murmurs, gallops or rubs.    Lungs:  Normal respiratory effort, chest expands symmetrically. Lungs are clear to auscultation, no crackles or wheezes. Abdomen: soft and non-tender without masses, organomegaly or hernias noted.  No guarding or rebound Extremities:  No cyanosis, edema, or deformity noted with good range of motion of all major joints.   Skin:  Intact without suspicious lesions or rashes     Assessment & Plan:

## 2011-02-02 NOTE — Assessment & Plan Note (Signed)
New.  No red flags but given PMH will check labs.   Maybe due to his new wife's activities.  Will follow.   His recent abdominal pain sounds benign likely transient gastric upset

## 2011-02-03 ENCOUNTER — Encounter: Payer: Self-pay | Admitting: Family Medicine

## 2011-03-23 ENCOUNTER — Inpatient Hospital Stay (HOSPITAL_COMMUNITY)
Admission: EM | Admit: 2011-03-23 | Discharge: 2011-03-29 | DRG: 416 | Disposition: A | Discharge status: Home / Self Care | Payer: Medicare Other | Attending: Surgery | Admitting: Surgery

## 2011-03-23 ENCOUNTER — Emergency Department (HOSPITAL_COMMUNITY): Payer: Medicare Other

## 2011-03-23 DIAGNOSIS — E785 Hyperlipidemia, unspecified: Secondary | ICD-10-CM | POA: Diagnosis present

## 2011-03-23 DIAGNOSIS — Z951 Presence of aortocoronary bypass graft: Secondary | ICD-10-CM

## 2011-03-23 DIAGNOSIS — I2510 Atherosclerotic heart disease of native coronary artery without angina pectoris: Secondary | ICD-10-CM | POA: Diagnosis present

## 2011-03-23 DIAGNOSIS — I079 Rheumatic tricuspid valve disease, unspecified: Secondary | ICD-10-CM | POA: Diagnosis present

## 2011-03-23 DIAGNOSIS — E876 Hypokalemia: Secondary | ICD-10-CM | POA: Diagnosis not present

## 2011-03-23 DIAGNOSIS — I4510 Unspecified right bundle-branch block: Secondary | ICD-10-CM | POA: Diagnosis present

## 2011-03-23 DIAGNOSIS — Z7982 Long term (current) use of aspirin: Secondary | ICD-10-CM

## 2011-03-23 DIAGNOSIS — K660 Peritoneal adhesions (postprocedural) (postinfection): Secondary | ICD-10-CM | POA: Diagnosis present

## 2011-03-23 DIAGNOSIS — E669 Obesity, unspecified: Secondary | ICD-10-CM | POA: Diagnosis present

## 2011-03-23 DIAGNOSIS — K812 Acute cholecystitis with chronic cholecystitis: Secondary | ICD-10-CM

## 2011-03-23 DIAGNOSIS — I10 Essential (primary) hypertension: Secondary | ICD-10-CM | POA: Diagnosis present

## 2011-03-23 DIAGNOSIS — R1013 Epigastric pain: Secondary | ICD-10-CM

## 2011-03-23 DIAGNOSIS — Z8546 Personal history of malignant neoplasm of prostate: Secondary | ICD-10-CM

## 2011-03-23 DIAGNOSIS — Z87891 Personal history of nicotine dependence: Secondary | ICD-10-CM

## 2011-03-23 DIAGNOSIS — Z85038 Personal history of other malignant neoplasm of large intestine: Secondary | ICD-10-CM

## 2011-03-23 DIAGNOSIS — R1011 Right upper quadrant pain: Secondary | ICD-10-CM

## 2011-03-23 DIAGNOSIS — K8000 Calculus of gallbladder with acute cholecystitis without obstruction: Principal | ICD-10-CM | POA: Diagnosis present

## 2011-03-23 DIAGNOSIS — Z9861 Coronary angioplasty status: Secondary | ICD-10-CM

## 2011-03-23 DIAGNOSIS — I059 Rheumatic mitral valve disease, unspecified: Secondary | ICD-10-CM | POA: Diagnosis present

## 2011-03-23 LAB — LIPASE, BLOOD: Lipase: 41 U/L (ref 11–59)

## 2011-03-23 LAB — BASIC METABOLIC PANEL
BUN: 18 mg/dL (ref 6–23)
GFR calc Af Amer: >60 mL/min (ref >60)
GFR calc non Af Amer: >60 mL/min (ref >60)
Potassium: 3.5 mEq/L (ref 3.5–5.1)
Sodium: 139 mEq/L (ref 135–145)

## 2011-03-23 LAB — DIFFERENTIAL
Basophils Absolute: 0 10*3/uL (ref 0.0–0.1)
Lymphocytes Relative: 6 % — ABNORMAL LOW (ref 12–46)
Lymphs Abs: 0.9 10*3/uL (ref 0.7–4.0)
Neutro Abs: 12.9 10*3/uL — ABNORMAL HIGH (ref 1.7–7.7)
Neutrophils Relative %: 89 % — ABNORMAL HIGH (ref 43–77)

## 2011-03-23 LAB — URINALYSIS, ROUTINE W REFLEX MICROSCOPIC
Bilirubin Urine: NEGATIVE
Ketones, ur: 15 mg/dL — AB
Nitrite: NEGATIVE
Urobilinogen, UA: 1 mg/dL (ref 0.0–1.0)

## 2011-03-23 LAB — CBC
HCT: 41.9 % (ref 39.0–52.0)
Hemoglobin: 14.9 g/dL (ref 13.0–17.0)
MCV: 83.3 fL (ref 78.0–100.0)
RBC: 5.03 MIL/uL (ref 4.22–5.81)
WBC: 14.5 10*3/uL — ABNORMAL HIGH (ref 4.0–10.5)

## 2011-03-23 LAB — TROPONIN I: Troponin I: <0.3 ng/mL (ref <0.30)

## 2011-03-23 LAB — HEPATIC FUNCTION PANEL
Bilirubin, Direct: 0.2 mg/dL (ref 0.0–0.3)
Indirect Bilirubin: 0.7 mg/dL (ref 0.3–0.9)
Total Bilirubin: 0.9 mg/dL (ref 0.3–1.2)

## 2011-03-23 LAB — CK TOTAL AND CKMB (NOT AT ARMC)
Relative Index: INVALID (ref 0.0–2.5)
Total CK: 69 U/L (ref 7–232)

## 2011-03-24 DIAGNOSIS — I2510 Atherosclerotic heart disease of native coronary artery without angina pectoris: Secondary | ICD-10-CM

## 2011-03-24 DIAGNOSIS — Z01810 Encounter for preprocedural cardiovascular examination: Secondary | ICD-10-CM

## 2011-03-24 LAB — SURGICAL PCR SCREEN
MRSA, PCR: NEGATIVE
Staphylococcus aureus: NEGATIVE

## 2011-03-24 LAB — CBC
HCT: 39.7 % (ref 39.0–52.0)
MCHC: 35.5 g/dL (ref 30.0–36.0)
RDW: 12.5 % (ref 11.5–15.5)

## 2011-03-24 LAB — COMPREHENSIVE METABOLIC PANEL
Albumin: 3.4 g/dL — ABNORMAL LOW (ref 3.5–5.2)
Alkaline Phosphatase: 68 U/L (ref 39–117)
BUN: 17 mg/dL (ref 6–23)
Potassium: 3.9 mEq/L (ref 3.5–5.1)
Total Protein: 6 g/dL (ref 6.0–8.3)

## 2011-03-24 LAB — LIPASE, BLOOD: Lipase: 40 U/L (ref 11–59)

## 2011-03-24 MED ORDER — IOHEXOL 300 MG/ML  SOLN
100.0000 mL | Freq: Once | INTRAMUSCULAR | Status: AC | PRN
  Administered 2011-03-24: 100 mL via INTRAVENOUS

## 2011-03-25 ENCOUNTER — Other Ambulatory Visit (INDEPENDENT_AMBULATORY_CARE_PROVIDER_SITE_OTHER): Payer: Self-pay | Admitting: General Surgery

## 2011-03-25 HISTORY — PX: CHOLECYSTECTOMY: SHX55

## 2011-03-25 NOTE — H&P (Signed)
NAMEEBRIMA, RANTA NO.:  [id number]  MEDICAL RECORD NO.:  [id number]  LOCATION:                                 FACILITY:  PHYSICIAN:  Somalia Segler A. Orvin Netter, M.D.DATE OF BIRTH:  [birth year]  DATE OF ADMISSION: DATE OF DISCHARGE:                             HISTORY & PHYSICAL   CHIEF COMPLAINT:  Abdominal pain.  HISTORY OF PRESENT ILLNESS:  The patient is a 76 year old male with approximately 8-hour history of epigastric and chest pain.  It started after dinner tonight.  The pain is severe in nature and located in his epigastrium with radiation to his chest.  The pain now is in his right upper quadrant.  No associated nausea.  Some mild shortness of breath. He is evaluated from a cardiac standpoint and did not show any abnormality, and a CT scan was obtained which showed multiple gallstones.  He was tender in his right upper quadrant.  I was asked to him at the request of Dr. Lynelle Doctor.  Currently, he is still having epigastric pain which is relieved with pain medicine but once this wears off, his pain returns in his right upper quadrant.  He denies any back pain.  PAST MEDICAL HISTORY: 1. History of coronary artery disease status post CABG. 2. Hypertension. 3. History of colon cancer status post partial colectomy 10 years ago. 4. Prostate cancer status post brachytherapy. 5. Hyperlipidemia. 6. Obesity.  SOCIAL HISTORY:  He used to be a former smoker.  Denies drug use.  He is no longer smoking.  Occasional drinker.  He is married.  FAMILY HISTORY:  Cancer and coronary artery disease.  REVIEW OF SYSTEMS:  Positive for chest pain, shortness of breath and epigastric abdominal pain, otherwise, negative x15 points.  MEDICATIONS:  Currently include Lopressor, meclizine, multivitamins, Tenormin, terazosin, aspirin, MiraLax, and simvastatin, doses unknown currently.  ALLERGIES TO MEDICINES:  None.  PHYSICAL EXAMINATION:  VITAL SIGNS:  Temperature is 98, pulse  83, blood pressure 168/78. GENERAL APPEARANCE:  A pleasant male, in no apparent distress. HEENT:  No jaundice.  Oropharynx moist. NECK:  Supple, nontender.  Trachea midline. PULMONARY:  Lung sounds are clear.  Chest wall motion normal. CARDIOVASCULAR:  Regular rate and rhythm without rub, murmur, or gallop. EXTREMITIES:  Warm, well perfused. ABDOMEN:  Tender in the right upper quadrant with some mild rebound in right upper quadrant.  Small incisional hernia noted, reducible.  No diffuse peritonitis. EXTREMITIES:  Muscle tone and range of motion are both normal. NEUROLOGIC:  Glasgow Coma Scale is 15.  Motor and sensory function are grossly intact.  DIAGNOSTIC STUDIES:  Abdominal and pelvic CT scan shows multiple gallstones in the distended gallbladder with questionable gallbladder wall thickening without pericholecystic fluid.  Right renal cyst noted. Incisional hernia noted.  No free air. He has a white count of 14,500, hemoglobin is 14, and platelet count is 180,000.  CK-MB is 2.4.  UA is normal.  Electrolytes within normal limits.  LFTs within normal limits.  Lipase is 41. Chest x-ray shows no acute cardiopulmonary disease.  IMPRESSION: 1. Acute cholecystitis. 2. History of coronary artery disease status post coronary artery     bypass graft.  3. Hyperlipidemia. 4. Hypertension.  PLAN:  I will admit for IV antibiotics, IV fluids.  I think he would benefit from laparoscopic cholecystectomy since he has acute cholecystitis on examination.  I will place him on Cipro and Dr. Manus Gunning will see him in the morning.  He may require cardiac clearance.  I have discussed all this with the patient's wife at bedside.  They understand the plan of care and agreed to proceed.     Cottrell Gentles A. Vicy Medico, M.D.     TAC/MEDQ  D:  03/24/2011  T:  03/24/2011  Job:  [id number]  Electronically Signed by Harriette Bouillon M.D. on 03/25/2011 02:57:05 PM

## 2011-03-26 DIAGNOSIS — R079 Chest pain, unspecified: Secondary | ICD-10-CM

## 2011-03-26 LAB — COMPREHENSIVE METABOLIC PANEL
ALT: 66 U/L — ABNORMAL HIGH (ref 0–53)
AST: 63 U/L — ABNORMAL HIGH (ref 0–37)
CO2: 28 mEq/L (ref 19–32)
Calcium: 8.2 mg/dL — ABNORMAL LOW (ref 8.4–10.5)
Chloride: 97 mEq/L (ref 96–112)
GFR calc non Af Amer: >60 mL/min (ref >60)
Sodium: 131 mEq/L — ABNORMAL LOW (ref 135–145)
Total Bilirubin: 1.2 mg/dL (ref 0.3–1.2)

## 2011-03-26 LAB — POCT I-STAT 4, (NA,K, GLUC, HGB,HCT)
Glucose, Bld: 107 mg/dL — ABNORMAL HIGH (ref 70–99)
HCT: 34 % — ABNORMAL LOW (ref 39.0–52.0)
Hemoglobin: 11.6 g/dL — ABNORMAL LOW (ref 13.0–17.0)

## 2011-03-26 LAB — CBC
Platelets: 140 10*3/uL — ABNORMAL LOW (ref 150–400)
RBC: 3.59 MIL/uL — ABNORMAL LOW (ref 4.22–5.81)
WBC: 9.2 10*3/uL (ref 4.0–10.5)

## 2011-03-27 NOTE — Op Note (Signed)
NAMESHAKA, Jacob Norton NO.:  [id number]  MEDICAL RECORD NO.:  [id number]  LOCATION:  5156                         FACILITY:  [facility]  PHYSICIAN:  Angelia Mould. Derrell Lolling, M.D.DATE OF BIRTH:  [birth year]  DATE OF PROCEDURE:  03/25/2011 DATE OF DISCHARGE:                              OPERATIVE REPORT   PREOPERATIVE DIAGNOSIS:  Acute cholecystitis with cholelithiasis.  POSTOPERATIVE DIAGNOSIS:  Severe acute cholecystitis with cholelithiasis, severe intra-abdominal adhesions.  OPERATION PERFORMED: 1. Diagnostic laparoscopy with lysis of adhesions requiring greater     than 30 minutes. 2. Open cholecystectomy.  SURGEON:  Angelia Mould. Derrell Lolling, MD  FIRST ASSISTANT:  Barnetta Chapel, PA  OPERATIVE INDICATIONS:  This is a 76 year old gentleman who was admitted to this hospital on March 24, 2011, in the early morning hours following an 8-hour history of epigastric pain, right upper quadrant pain and nausea.  Cardiac evaluation did not show any abnormality.  CT scan showed multiple gallstones.  Exam revealed epigastric and right upper quadrant tenderness.  He was admitted and placed on antibiotics.  He has remained stable with normal vital signs.  He has an elevated white count of 14,000, but normal liver function tests.  He has been seen by Dr. Eden Emms his cardiologist who feels that his coronary artery disease is stable.  He is noted to have a ventral incisional hernia from previous midline incision with resection for colon cancer.  He is told that the optimal repair of this should be done electively rather than acutely during his gallbladder surgery.  He was counseled regarding the gallbladder surgery and is brought to operating room semi urgently.  OPERATIVE FINDINGS:  The patient had extensive omental and small bowel adhesions to the anterior abdominal wall.  We could take most of these down and could see fairly well in the right upper quadrant, but we could not see the  gallbladder due to intense inflammation, purulent exudate and extensive dense adhesions of the omentum and small bowel to the liver.  The gallbladder itself was markedly thick-walled and acutely inflamed.  It contained numerous dark stones and purulent bile.  The cystic duct was very tiny.  The liver appeared healthy.  I saw no signs of cancer.  He had a ventral hernia in the midline midway between the umbilicus and xiphoid with a defect approximately 8 cm in diameter.  We did not attempt to repair that at this time.  OPERATIVE TECHNIQUE:  Following the induction of general endotracheal anesthesia, the patient's abdomen was prepped and draped in sterile fashion.  Surgical time-out was held and identifying correct patient, correct procedure.  A 0.5% Marcaine with epinephrine was used as local infiltration anesthetic.  A 5-mm optical trocar was placed in the left subcostal region and that trocar entry was uneventful and atraumatic. Pneumoperitoneum was created.  Video camera was inserted with visualization and findings as described above.  I placed another 5-mm trocar in the left upper quadrant.  Using sharp scissor dissection and some Harmonic dissection and some blunt dissection, I took down all of the omental adhesions from the midline all the way down to just about the umbilicus.  I took down some of  the small bowel adhesions then that was uneventful as these adhesions were soft.  I placed a 5-mm trocar in the right upper quadrant and I could look directly at the liver and tried to dissect the omentum and gallbladder out, but I could not see the gallbladder or create a dissection plane.  I felt that it was unwise to persist laparoscopically, and so I abandoned the laparoscopic approach.  Pneumoperitoneum was released and trocars were removed.  A right subcostal incision was made.  Subcutaneous tissue was divided with electrocautery.  The rectus muscle and a little bit of the  external oblique and internal oblique laterally were divided with electrocautery. The peritoneum was elevated and entered atraumatically.  Self-retaining retractors were placed.  I carefully dissected the omentum away from the undersurface of the liver and then identified the gallbladder.  I bluntly dissected the omentum away from the gallbladder.  The gallbladder was very inflamed and tissues around it, especially the liver bled easily.  We dissected the gallbladder out and away from the liver.  The cystic artery was noted to be bleeding and it was clamped and suture ligated with a 2-0 silk suture ligatures and that control the bleeding from the cystic artery.  We incised the very thickened peritoneum over the distal infundibulum of the gallbladder and we were able to dissect down until we had nothing left, but a very thin cystic duct.  As the patient's liver function tests were quite normal, we elected not to do a cholangiogram.  We placed multiple metal clips on the cystic duct and divided the cystic duct and removed the gallbladder. We then copiously irrigated the subhepatic and subphrenic spaces with several liters of saline.  There were no retained stones.  There was nobleeding.  There was no bile leak.  The liver bed was raw, so we placed 2 large pieces of SNoW hemostatic sponge there.  We placed a 19-French Blake drain in the subhepatic space and brought that out in the right flank through one of the trocar sites.  This was sutured to the skin with nylon suture and connected to the suction bulb.  After we were satisfied that everything was hemostatic and clean, we closed the abdominal wall.  The posterior rectus sheath and the internal oblique and transversus muscles were closed with running suture of #1 PDS.  The anterior rectus sheath in the external oblique were closed with running suture of #1 PDS.  Skin was closed loosely with skin staples placing Telfa wicks in between.   Clean bandages were placed and the patient was taken to recovery room in stable condition.  Estimated blood loss was about 500-700 mL.  Complications were none.  Sponge, needle and instruments counts were correct.     Angelia Mould. Derrell Lolling, M.D.     HMI/MEDQ  D:  03/25/2011  T:  03/26/2011  Job:  [id number]  cc:   Pearlean Brownie, M.D. Noralyn Pick. Eden Emms, MD, [facility]  Electronically Signed by Claud Kelp M.D. on 03/27/2011 10:00:54 PM

## 2011-03-28 LAB — BASIC METABOLIC PANEL
Calcium: 8.5 mg/dL (ref 8.4–10.5)
Creatinine, Ser: 1.11 mg/dL (ref 0.50–1.35)
GFR calc non Af Amer: >60 mL/min (ref >60)
Glucose, Bld: 84 mg/dL (ref 70–99)
Sodium: 139 mEq/L (ref 135–145)

## 2011-03-28 LAB — CBC
Hemoglobin: 9.6 g/dL — ABNORMAL LOW (ref 13.0–17.0)
MCH: 28.8 pg (ref 26.0–34.0)
MCHC: 33.9 g/dL (ref 30.0–36.0)
MCV: 85 fL (ref 78.0–100.0)

## 2011-03-29 LAB — CROSSMATCH
ABO/RH(D): O NEG
Unit division: 0

## 2011-03-29 LAB — BASIC METABOLIC PANEL
BUN: 12 mg/dL (ref 6–23)
Calcium: 8.8 mg/dL (ref 8.4–10.5)
Creatinine, Ser: 1.22 mg/dL (ref 0.50–1.35)
GFR calc Af Amer: >60 mL/min (ref >60)
GFR calc non Af Amer: 58 mL/min — ABNORMAL LOW (ref >60)
Potassium: 3.5 mEq/L (ref 3.5–5.1)

## 2011-03-31 ENCOUNTER — Telehealth (INDEPENDENT_AMBULATORY_CARE_PROVIDER_SITE_OTHER): Payer: Self-pay

## 2011-03-31 NOTE — Telephone Encounter (Signed)
Pt called c/o po constipation. Last bm was preop. No abd pain. Minimal bloating per pt. Pt advised pt Dr Jacinto Halim order to use miralax 17gm bid q 12h , MOM 4 tbs bid if needed, can use fleets enema if needed, D/C narcotics asap. Pt to keep appt for 04-01-11 and call if other symptoms occur.

## 2011-04-01 ENCOUNTER — Encounter (INDEPENDENT_AMBULATORY_CARE_PROVIDER_SITE_OTHER): Payer: Self-pay | Admitting: General Surgery

## 2011-04-01 ENCOUNTER — Ambulatory Visit (INDEPENDENT_AMBULATORY_CARE_PROVIDER_SITE_OTHER): Payer: Medicare Other | Admitting: General Surgery

## 2011-04-01 DIAGNOSIS — K432 Incisional hernia without obstruction or gangrene: Secondary | ICD-10-CM

## 2011-04-01 DIAGNOSIS — K810 Acute cholecystitis: Secondary | ICD-10-CM

## 2011-04-01 NOTE — Patient Instructions (Signed)
Take a 30 minute walk everyday. It is okay to shower. Remove the Steri-Strips in one week. No sports or heavy lifting for 4-5 weeks. I will see you back in 2-3 months and will take a look at the ventral hernia then.

## 2011-04-01 NOTE — Progress Notes (Signed)
Subjective:     Patient ID: Jacob Norton, male   DOB: June 17, 1935, 75 y.o.   MRN: [id number]  HPI This is a 76 year old gentleman who underwent diagnostic laparoscopy with extensive lysis of adhesions, and we converted to an open cholecystectomy because of severe acute cholecystitis. He had had a previous colectomy for colon cancer in the past. He had a known ventral hernia which we did not repair this time.  He was discharged home 48 hours ago. He has chronic problems with constipation and this has continued to be a problem. Yesterday he took MiraLax twice and milk of magnesia twice, and finally his bowel movements have started back. He feels much better. He is able to eat. He has no wound problems other than soreness. He has no fever or chills. His drain was removed prior to discharge.  Past Medical History  Diagnosis Date  . Hypertension   . Hyperlipidemia   . Blood transfusion      Current outpatient prescriptions:aspirin 81 MG tablet, Take 81 mg by mouth daily.  , Disp: , Rfl: ;  atenolol (TENORMIN) 25 MG tablet, Take 1 tablet (25 mg total) by mouth daily., Disp: 90 tablet, Rfl: 2;  Fish Oil OIL, daily.  , Disp: , Rfl: ;  HYDROcodone-acetaminophen (VICODIN) 5-500 MG per tablet, Take 1 tablet by mouth every 6 (six) hours as needed., Disp: 30 tablet, Rfl: 1 meclizine (ANTIVERT) 25 MG tablet, Take 25 mg by mouth as needed.  , Disp: , Rfl: ;  Multiple Vitamins-Minerals (CENTRUM SILVER PO), Take 1 tablet by mouth daily.  , Disp: , Rfl: ;  simvastatin (ZOCOR) 80 MG tablet, 1/2 TAB PO QD , Disp: , Rfl: ;  terazosin (HYTRIN) 5 MG capsule, Take 1 capsule (5 mg total) by mouth daily., Disp: 30 capsule, Rfl: 3  No Known Allergies  History reviewed. No pertinent family history.  History  Substance Use Topics  . Smoking status: Former Smoker    Types: Cigarettes    Quit date: 09/13/1995  . Smokeless tobacco: Not on file   Comment: Smokes half a cigar once a month  . Alcohol Use: 0.5 oz/week    1  drink(s) per week      Review of Systems     Objective:   Physical Exam    The patient looks well. His wife is with him. He is in no distress.  Abdomen soft and nontender. He is not distended. The right subcostal incision and the 2 trocar sites in the left side are healing uneventfully. Staples are removed and Steri-Strips are applied. Also noted is a well-healed midline scar with a ventral hernia and the defect probably about 7-8 cm in size. This is easily reducible and nontender. Assessment:     Acute cholecystitis with cholelithiasis, recovering uneventfully in the early post op period.  Remote history of colectomy for colon cancer, details unknown.  Reducible ventral hernia. Patient is questioning whether he should have this repaired at some point in the future.    Plan:     He is advised to take a walk every day. He is advised to stay hydrated.  Remove Steri-Strips in one week.  Patient may drive when he feels ready.  No sports or heavy lifting for another 4 weeks.  Return to see me in 2-3 months to check his ventral hernia and have further discussion.

## 2011-04-11 ENCOUNTER — Other Ambulatory Visit: Payer: Self-pay | Admitting: Family Medicine

## 2011-04-12 NOTE — Telephone Encounter (Signed)
Refill request

## 2011-04-15 ENCOUNTER — Other Ambulatory Visit: Payer: Self-pay | Admitting: Family Medicine

## 2011-04-15 NOTE — Telephone Encounter (Signed)
Refill request

## 2011-04-18 ENCOUNTER — Telehealth (INDEPENDENT_AMBULATORY_CARE_PROVIDER_SITE_OTHER): Payer: Self-pay

## 2011-04-18 NOTE — Telephone Encounter (Signed)
Patient wanted to know if he needs to ride a stationary bicycle or use a treadmill to get exercise?  She states he doesn't walk well.  I stated they both would be beneficial but if he doesn't walk well the bike would be safer

## 2011-04-19 NOTE — Discharge Summary (Signed)
NAMEADREYAN, CARBAJAL NO.:  [id number]  MEDICAL RECORD NO.:  [id number]  LOCATION:  5156                         FACILITY:  [facility]  PHYSICIAN:  Sandria Bales. Ezzard Standing, M.D.  DATE OF BIRTH:  02-22-1935  DATE OF ADMISSION:  03/23/2011 DATE OF DISCHARGE:  03/29/2011                              DISCHARGE SUMMARY   HISTORY OF PRESENT ILLNESS:  Mr. Orona is a pleasant 76 year old gentleman who presented with complaint of abdominal pain.  He has an underlying history of coronary artery disease, hypertension, history of colon cancer with prior partial colectomy about 10 years ago, and a history of prostate cancer status post brachytherapy.    He presented with a CT showing evidence of gallstones and a distended gallbladder with some wall thickening consistent with cholecystitis.  Dr. Luisa Hart initially saw the patient and felt the patient was appropriate for admission and ultimately surgical intervention.  However, given his heart history, he felt the cardiologist would need to see the patient preoperatively for clearance.  SUMMARY OF HOSPITAL COURSE:   The patient was seen on March 24, 2011, and admitted by Dr. Harriette Bouillon.  He was subsequently seen by Dr. Gala Romney from the Cardiology Team for cardiac evaluation preoperatively.  He was thought to be in good condition, had a mild to moderate risk of perioperative cardiac complication.  He did not need any further cardiac testing preoperatively.    After that was accomplished, the patient was taken to the operating room on March 25, 2011, by Dr. Derrell Lolling undergoing an attempted laparoscopic cholecystectomy but subsequently converting to an open cholecystectomy due to severe adhesions.  The patient tolerated the procedure, surgical drain was placed, and the patient was admitted to the floor in stable condition. He had no significant cardiac issues postoperatively and the cardiologist signed off as the patient appeared to be  doing well.  The patient was started on a little bit of a clear liquid diet, had some mild urinary retention, but eventually got his Foley catheter removed without difficulty and has been voiding fine on his own.  His diet has been advanced to clears to fulls and ultimately to a heart-healthy diet which he has tolerated well.    He has been passing flatus but no bowel movement.  The patient states he typically takes a regimen of some small doses of MiraLax at home to allow his normal bowel function.  He had mild hypokalemia which was corrected and repleted during his hospital course.  I feel at this point on postoperative day #4 that the patient is stable for discharge home.  DISCHARGE DIAGNOSES: 1. Acute cholecystitis status post open cholecystectomy. 2. Coronary artery disease - stable. 3. Hypertension - stable.  DISCHARGE MEDICATIONS:  The patient will resume all home medications including: 1. CoQ10 once daily. 2. MiraLax 17 g daily as needed. 3. Cinnamon supplement once daily. 4. Vitamin D supplement once daily. 5. Terazosin 5 mg daily. 6. Simvastatin 80 mg 1-1/2 tablet daily. 7. Multivitamin once daily. 8. Meclizine 25 mg as needed. 9. He was given prescription for Vicodin 5/500 1-2 tablets q. 6 h.     p.r.n. pain. 10.Fish oil once daily.  11.Atenolol 25 mg daily. 12.Aspirin 81 mg daily.  PLAN:  The patient's drain was removed prior to discharge as it was serosanguineous and nonbilious.  His wicks were removed from his incision.  He is instructed that he can shower and wash these areas with soap and water on a daily basis and place a dry dressing over these areas as needed.   His activities and dietary restrictions and limitations were reviewed and preprinted discharge instructions were given to the patient.   He will contact our office to follow up with Dr. Derrell Lolling in approximately 7-10 day time for followup evaluation and staple removal.   Brayton El,  PA-C  Sandria Bales. Ezzard Standing, M.D.  KB/MEDQ  D:  03/29/2011  T:  03/29/2011  Job:  [id number]  Electronically Signed by Brayton El  on 04/18/2011 02:57:23 PM Electronically Signed by Ovidio Kin M.D. on 04/19/2011 09:21:16 AM

## 2011-05-10 NOTE — Consult Note (Signed)
Jacob Norton, Jacob NO.:  [id number]  MEDICAL RECORD NO.:  [id number]  LOCATION:  5156                         FACILITY:  [facility]  PHYSICIAN:  Bevelyn Buckles. Bensimhon, MDDATE OF BIRTH:  Oct 11, 1934  DATE OF CONSULTATION:  03/24/2011 DATE OF DISCHARGE:                                CONSULTATION   REQUESTING PHYSICIAN:  Cherylynn Ridges, MD  PRIMARY CARE PHYSICIAN:  Pearlean Brownie, MD  CARDIOLOGIST:  Noralyn Pick. Eden Emms, MD, [facility]  REASON FOR CONSULTATION:  Preop cardiac risk stratification.  Mr. 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  He has a history of hypertension, hyperlipidemia, and coronary artery disease.  Apparently he began experiencing angina with low level exertion in 1995 and underwent stenting of his LAD.  This apparently failed and he then underwent what he described as a 1-vessel bypass with the LIMA to the LAD.  He is followed with Dr. Eden Emms recently.  He had a Myoview in April 2010 which was normal.  Currently he is not overly activity.  He says he used to walk about a half mile with his wife without any significant problems.  He has not had any recent angina.  He was admitted last night with apparent acute cholecystitis with a significant white count and an abnormal gallbladder.  He is pending surgery and we are asked to consult to give him preoperative risk stratification.  As above he denies any chest pain.  He has not had any heart failure. No palpitations or syncope.  He had an echocardiogram in 2010 which showed an EF of 55-60%.  The aortic valve was normal.  There was no significant valvular disease otherwise.  REVIEW OF SYSTEMS:  He has low-grade temperatures and some nausea and abdominal pain.  No diarrhea.  He has occasional constipation.  No bleeding.  Remainder of review of systems, all systems are negative except for HPI and problem list.  PROBLEM  LIST: 1. CAD.     a.     Status post 1-vessel CABG, LIMA to the LAD in 1995. 2. Normal Myoview in April 2010. 3. Hypertension. 4. Hyperlipidemia. 5. History of colon cancer status post partial colectomy in 2002. 6. Obesity. 7. History of prostate cancer status post brachytherapy. 8. Mnire disease.  CURRENT MEDICATIONS:  Unasyn, aspirin, Cipro, morphine and Zofran.  He is on Lopressor and atenolol at home.  I am not sure why he is on 2 beta- blockers.  SOCIAL HISTORY:  He lives in [place] with his wife.  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  Denies any current tobacco use.  Does have occasional alcohol.  ALLERGIES:  He has no known drug allergies.  PHYSICAL EXAMINATION:  GENERAL:  He is in no acute distress. VITAL SIGNS:  Blood pressure is 138/74, heart rate 89.  He is afebrile, satting 97% on room air. HEENT:  Normal. NECK:  Supple.  No JVD.  Carotids are 2+ bilaterally without bruits. There is no lymphadenopathy or thyromegaly. CARDIAC:  PMI is nondisplaced.  Regular rate and rhythm.  No murmurs, rubs, or  gallops. LUNGS:  Clear. ABDOMEN:  Obese.  He has hypoactive bowel sounds.  He is tender over the right upper quadrant.  No rebound or guarding. EXTREMITIES:  Warm with no cyanosis, clubbing or edema.  No rash. NEURO:  Alert and oriented x3.  Cranial nerves II-XII are intact.  Moves all 4 extremities without difficulty.  Affect is pleasant.  White count is 12.1, hemoglobin is 14.1, platelets are 155.  Sodium 134, potassium 3.9, BUN 17, creatinine 1.0, albumin is 3.4.  Cardiac markers are normal.  EKG shows sinus rhythm at a rate of 87 with an incomplete right bundle branch block.  No acute ST-T wave abnormalities.  ASSESSMENT: 1. Acute cholecystitis. 2. History of coronary artery disease status post 1-vessel CABG in     1995.     a.     Normal Myoview in 2010. 3. Fairly sedentary lifestyle.  PLAN/DISCUSSION:  Overall I feel that Jacob Norton  is at mild to moderate risk of perioperative cardiac complications.  However, do not think this is prohibitive.  He had a stress test 2 years ago which look good.  I think the best plan is for him to proceed with surgery without any further cardiac testing.  I have discussed this with both him and his wife and we have agreed to proceed.  We will follow him closely.     Bevelyn Buckles. Bensimhon, MD     DRB/MEDQ  D:  03/24/2011  T:  03/24/2011  Job:  [id number]  cc:   Cherylynn Ridges, M.D. Pearlean Brownie, M.D. Noralyn Pick. Eden Emms, MD, [facility]  Electronically Signed by Arvilla Meres MD on 05/10/2011 05:17:27 PM

## 2011-05-23 ENCOUNTER — Ambulatory Visit (INDEPENDENT_AMBULATORY_CARE_PROVIDER_SITE_OTHER): Payer: Medicare Other | Admitting: General Surgery

## 2011-05-23 ENCOUNTER — Encounter (INDEPENDENT_AMBULATORY_CARE_PROVIDER_SITE_OTHER): Payer: Self-pay | Admitting: General Surgery

## 2011-05-23 VITALS — BP 132/78 | HR 80

## 2011-05-23 DIAGNOSIS — Z9889 Other specified postprocedural states: Secondary | ICD-10-CM

## 2011-05-23 NOTE — Patient Instructions (Signed)
You have recovered from your gallbladder surgery without any obvious complications. I advise a low fat diet. There are no restrictions on your  physical activity. Return to see me in the future when and if you desire to have the ventral hernia repaired surgically.

## 2011-05-23 NOTE — Progress Notes (Signed)
Subjective:     Patient ID: Jacob Norton, male   DOB: 15-May-1935, 76 y.o.   MRN: [id number]  HPI This is a very pleasant 76 year old gentleman who was taken to the operating room on March 25, 2011. He had severe acute and chronic cholecystitis with cholelithiasis. We did a 30 minute lysis of adhesions which exposed the liver but the omental adhesions and inflammation of the gallbladder were so severe that he had to be converted to an open operation with a right subcostal incision. Cholecystectomy was performed. He has recovered uneventfully. Final pathology report shows acute and chronic cholecystitis with cholelithiasis.  He is  eating well. Having normal bowel movements. He has no wound problems. Has no pain.  His past history is significant for coronary artery artery disease followed by Dr. Charlton Haws. He has a ventral hernia from previous colectomy for colon cancer which was done in [place] 15 or 20 years ago. His last colonoscopy was in 2012 by [facility].  Review of Systems     Objective:   Physical Exam The patient is here with his wife. He looks good.  Abdomen soft and nontender. Right subcostal incision is well healed without complication.  He has a midline scar from his colon resection in the past. He has a small ventral hernia above the umbilicus and protruding slightly to the right of midline which is easily reducible. It is nontender.    Assessment:     Acute cholecystitis with cholelithiasis with severe intra-abdominal adhesions, recovering uneventfully the following open cholecystectomy on March 25, 2011.  Ventral incisional hernia from his previous colon resection. Asymptomatic and reducible.  Coronary artery disease, stable.  History of colon cancer status post colectomy in [facility], details unknown.    Plan:     Diet and activities discussed.  We discussed his ventral hernia. He is not interested in having that repaired at this time. He will return to see me if  it enlarges, becomes painful, or he elects to do this at some point in the future.  Otherwise see me p.r.n.

## 2011-06-05 ENCOUNTER — Other Ambulatory Visit: Payer: Self-pay | Admitting: Family Medicine

## 2011-06-05 NOTE — Telephone Encounter (Signed)
Refill request

## 2011-07-11 ENCOUNTER — Ambulatory Visit (INDEPENDENT_AMBULATORY_CARE_PROVIDER_SITE_OTHER): Payer: Medicare Other | Admitting: Family Medicine

## 2011-07-11 ENCOUNTER — Encounter: Payer: Self-pay | Admitting: Family Medicine

## 2011-07-11 DIAGNOSIS — R42 Dizziness and giddiness: Secondary | ICD-10-CM | POA: Insufficient documentation

## 2011-07-11 NOTE — Assessment & Plan Note (Signed)
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  No signs of CNS focal disease.  Will treat symptomatically

## 2011-07-11 NOTE — Patient Instructions (Signed)
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  Take the antivert as needed  If any focal weakness or visual changes then call us  Use hydrocortiosone 1% ointment twice daily on the back spots - if not gone in 2 months come back

## 2011-07-11 NOTE — Progress Notes (Signed)
  Subjective:    Patient ID: ARIAN MURLEY, male    DOB: 26-Mar-1935, 76 y.o.   MRN: [id number]  HPI  DIZZY  Onset last Tuesday  Description feels like the room is spinning.  Similar to meniere's symptoms he has had before Better with: moving around and Antivert Worse with: when first wakes up   Symptoms Hearing loss: nothing new Ear pain/fullness: no Nausea/vomiting: no  Loss of vision: no History of trauma: no   Red Flags Focal weakness: no  Trouble speaking: no  Severe headache: no  On anticoagulants: no     Review of Systems See above  Review of Symptoms - see HPI  PMH - Smoking status noted.          Physical Exam  Neurologic exam : Cn 2-7 intact Strength equal & normal in upper & lower extremities Able to walk on heels and toes.   Balance normal  Romberg normal, finger to nose normal Heart - Regular rate and rhythm.  No murmurs, gallops or rubs.    Lungs:  Normal respiratory effort, chest expands symmetrically. Lungs are clear to auscultation, no crackles or wheezes. Extremities:  No cyanosis, edema, or deformity noted with good range of motion of all major joints.   Eye - Pupils Equal Round Reactive to light, Extraocular movements intact, Fundi without hemorrhage or visible lesions, Conjunctiva without redness or discharge      Assessment & Plan:

## 2011-07-28 ENCOUNTER — Telehealth: Payer: Self-pay | Admitting: Family Medicine

## 2011-07-28 MED ORDER — DIFLORASONE DIACETATE 0.05 % EX OINT: TOPICAL_OINTMENT | Freq: Every day | CUTANEOUS | Status: DC

## 2011-07-28 NOTE — Telephone Encounter (Signed)
Patient needs a prescription called in to [facility] on [address] for Diflorasone Diacetate Ointment .05%.

## 2011-08-07 ENCOUNTER — Other Ambulatory Visit: Payer: Self-pay | Admitting: Family Medicine

## 2011-08-07 NOTE — Telephone Encounter (Signed)
Refill request

## 2011-08-15 ENCOUNTER — Other Ambulatory Visit: Payer: Self-pay | Admitting: *Deleted

## 2011-08-15 MED ORDER — ATENOLOL 25 MG PO TABS: 25.0000 mg | ORAL_TABLET | Freq: Every day | ORAL | Status: DC

## 2011-09-21 ENCOUNTER — Encounter: Payer: Self-pay | Admitting: Family Medicine

## 2011-09-21 ENCOUNTER — Ambulatory Visit (INDEPENDENT_AMBULATORY_CARE_PROVIDER_SITE_OTHER): Payer: Medicare Other | Admitting: Family Medicine

## 2011-09-21 VITALS — BP 116/69 | HR 87 | Temp 97.6°F | Ht 65.0 in | Wt 192.3 lb

## 2011-09-21 DIAGNOSIS — Z23 Encounter for immunization: Secondary | ICD-10-CM

## 2011-09-21 DIAGNOSIS — K5289 Other specified noninfective gastroenteritis and colitis: Secondary | ICD-10-CM

## 2011-09-21 DIAGNOSIS — K529 Noninfective gastroenteritis and colitis, unspecified: Secondary | ICD-10-CM

## 2011-09-21 NOTE — Progress Notes (Signed)
  Subjective:    Patient ID: BRALLAN DENIO, male    DOB: September 27, 1934, 77 y.o.   MRN: [id number]  HPI  GI Symptoms  3 days ago had nausea/vomting and diarrhea for about 24 hours.  No sick contacts or suspect food.  No fever or bleeding or stomach pain.   This resolved but had heart burn symptoms for the last 2 days which improved with wifes Zantac.   No bleeding.  Has chronic unchanged mild food sticking which has gone on for years.  No weight loss.   No lightheadness on standing Taking oral well now  Review of Symptoms - see HPI  PMH - Smoking status noted.     Review of Systems     Objective:   Physical Exam No acute distress Lungs:  Normal respiratory effort, chest expands symmetrically. Lungs are clear to auscultation, no crackles or wheezes. Heart - Regular rate and rhythm.  No murmurs, gallops or rubs.    Abdomen: soft and non-tender without masses, organomegaly or hernias noted.  No guarding or rebound Extremities:  No cyanosis, edema, or deformity noted with good range of motion of all major joints.          Assessment & Plan:   Gastroenteritis with Reflux No red flags.  Seems to have recovered completely.  Use as needed otc antiacid and watch for worsening or returning symptoms

## 2011-09-21 NOTE — Patient Instructions (Signed)
I think you had a stomach flu  You need to contact me if the vomiting or diarrhea returns  Use the Zantac 75 mg tabs up to twice daily as needed for heart burn  If the heart burn gets worse or if food sticking gets worse then call me

## 2011-10-12 ENCOUNTER — Other Ambulatory Visit: Payer: Self-pay | Admitting: Family Medicine

## 2011-10-12 NOTE — Telephone Encounter (Signed)
Refill request

## 2012-01-18 ENCOUNTER — Other Ambulatory Visit: Payer: Self-pay | Admitting: Family Medicine

## 2012-03-05 ENCOUNTER — Other Ambulatory Visit: Payer: Self-pay | Admitting: *Deleted

## 2012-03-06 MED ORDER — MECLIZINE HCL 25 MG PO TABS: 25.0000 mg | ORAL_TABLET | Freq: Three times a day (TID) | ORAL | Status: DC | PRN

## 2012-03-07 ENCOUNTER — Other Ambulatory Visit: Payer: Self-pay | Admitting: Family Medicine

## 2012-04-06 ENCOUNTER — Other Ambulatory Visit: Payer: Self-pay | Admitting: Family Medicine

## 2012-05-21 ENCOUNTER — Encounter: Payer: Self-pay | Admitting: Family Medicine

## 2012-05-21 ENCOUNTER — Ambulatory Visit (INDEPENDENT_AMBULATORY_CARE_PROVIDER_SITE_OTHER): Payer: Medicare Other | Admitting: Family Medicine

## 2012-05-21 VITALS — BP 112/71 | HR 62 | Temp 98.5°F | Ht 65.0 in | Wt 194.1 lb

## 2012-05-21 DIAGNOSIS — R5381 Other malaise: Secondary | ICD-10-CM

## 2012-05-21 DIAGNOSIS — R5383 Other fatigue: Secondary | ICD-10-CM

## 2012-05-21 DIAGNOSIS — K219 Gastro-esophageal reflux disease without esophagitis: Secondary | ICD-10-CM

## 2012-05-21 DIAGNOSIS — Z23 Encounter for immunization: Secondary | ICD-10-CM

## 2012-05-21 DIAGNOSIS — Z8546 Personal history of malignant neoplasm of prostate: Secondary | ICD-10-CM

## 2012-05-21 LAB — CBC
MCH: 29.3 pg (ref 26.0–34.0)
MCHC: 34.5 g/dL (ref 30.0–36.0)
MCV: 84.9 fL (ref 78.0–100.0)
Platelets: 209 10*3/uL (ref 150–400)
RDW: 13.1 % (ref 11.5–15.5)

## 2012-05-21 NOTE — Assessment & Plan Note (Signed)
Symptoms sound very consistent with this without red flags for cancer.  Will treat with increased H2blocker and follow

## 2012-05-21 NOTE — Patient Instructions (Addendum)
Take the Zantac twice daily if not helping enough with the reflux then take 2 tabs twice daily  I will call you if your tests are not good.  Otherwise I will send you a letter.  If you do not hear from me with in 2 weeks please call our office.     If the fatigue does not get better in 2-3 months or if gets worse then come back

## 2012-05-21 NOTE — Progress Notes (Signed)
  Subjective:    Patient ID: Jacob Norton, male    DOB: 06-03-1935, 77 y.o.   MRN: [id number]  HPI  Reflux Seems to be worsening.  With lots of foods after eating he feels a burning sensation in his throat and has a mild nonprocuditive cough.  No food sticking or weight loss or vomitng or gi bleeding.   No chest pain with exertion Taking one 75mg  zantac each evnening which seems to help a little.  No using antiacids  Fatigue Napping a lot more during the day with less energy to do things "although I don't have anything to do"   Worsening over the last year.   No weight loss or fever or adenopathy or bleeding  PMH - history of anemia is past when in [place].   Not sure of the cause  Review of Symptoms - see HPI  PMH - Smoking status noted.    Review of Systems     Objective:   Physical Exam Heart - Regular rate and rhythm.  No murmurs, gallops or rubs.    Lungs:  Normal respiratory effort, chest expands symmetrically. Lungs are clear to auscultation, no crackles or wheezes. Extremities:  No cyanosis, edema, or deformity noted with good range of motion of all major joints.   Abdomen: soft and non-tender without masses, organomegaly or hernias noted.  No guarding or rebound         Assessment & Plan:

## 2012-05-21 NOTE — Assessment & Plan Note (Signed)
Recommended he consider follow up with urologist

## 2012-05-21 NOTE — Assessment & Plan Note (Signed)
New over last year.  No red flags on history or physical. Will check labs especially with his history of anemia and reflux.

## 2012-05-22 ENCOUNTER — Encounter: Payer: Self-pay | Admitting: Family Medicine

## 2012-05-22 LAB — COMPREHENSIVE METABOLIC PANEL
ALT: 11 U/L (ref 0–53)
AST: 16 U/L (ref 0–37)
BUN: 18 mg/dL (ref 6–23)
Creat: 1.35 mg/dL (ref 0.50–1.35)
Total Bilirubin: 1.1 mg/dL (ref 0.3–1.2)

## 2012-07-03 ENCOUNTER — Other Ambulatory Visit: Payer: Self-pay | Admitting: Family Medicine

## 2012-07-23 ENCOUNTER — Telehealth: Payer: Self-pay | Admitting: Family Medicine

## 2012-07-23 NOTE — Telephone Encounter (Signed)
Patient states he took a shower earlier today and bent head over and nose started bleeding. Then later today started bleeding again.  He has been able to control the bleeding easily each time.   Has felt like he was starting cold symptoms . Has taken Dayquil today.  . Will ask Dr. Deirdre Priest if patient needs appointment . He takes 81 mg ASA daily.

## 2012-07-23 NOTE — Telephone Encounter (Signed)
Is having nosebleeds and is not sure if he needs to come in to see someone.  pls advise

## 2012-07-24 NOTE — Telephone Encounter (Signed)
Spoke with Dr. Deirdre Priest and he advises to use cool mist humidifier, , little vaseline in nose.  If bleeding continues will need to be seen. Patient notified.

## 2012-07-28 ENCOUNTER — Other Ambulatory Visit: Payer: Self-pay | Admitting: Family Medicine

## 2012-08-06 ENCOUNTER — Encounter: Payer: Self-pay | Admitting: Family Medicine

## 2012-08-06 ENCOUNTER — Ambulatory Visit (INDEPENDENT_AMBULATORY_CARE_PROVIDER_SITE_OTHER): Payer: Medicare Other | Admitting: Family Medicine

## 2012-08-06 VITALS — BP 128/74 | HR 62 | Temp 98.1°F | Ht 68.0 in | Wt 195.6 lb

## 2012-08-06 DIAGNOSIS — K219 Gastro-esophageal reflux disease without esophagitis: Secondary | ICD-10-CM

## 2012-08-06 DIAGNOSIS — R413 Other amnesia: Secondary | ICD-10-CM

## 2012-08-06 MED ORDER — OMEPRAZOLE 20 MG PO CPDR: 20.0000 mg | DELAYED_RELEASE_CAPSULE | Freq: Every day | ORAL | Status: DC

## 2012-08-06 NOTE — Assessment & Plan Note (Signed)
Seems most consistent with age and activity related loss.  Since not affecting his life will monitor for now

## 2012-08-06 NOTE — Progress Notes (Signed)
  Subjective:    Patient ID: Jacob Norton, male    DOB: [birth year], 77 y.o.   MRN: [id number]  HPI   Memory loss Sometimes can't remember if he locked his work door or not when he is rushing.  If not in a hurry usually remembers well.  Has not gotten lost or forgetten peoples names.  Does not interfere with his daily life. PMH - recent blood work was all normla   Reflux Continues to have heartburn when he eats a lot or late or with sweets.  Using zantac twice daily.  Rarely has food stick when he tries to swallow.  No bleeding or weight loss.    PMH - had upper endo about 10 years ago - no sure of finding but no cancer .  Recent cbc normal  Review of Symptoms - see HPI  PMH - Smoking status noted.     Review of Systems     Objective:   Physical Exam Alert no acute distress  Knows the names and dosing of all his medications       Assessment & Plan:

## 2012-08-06 NOTE — Patient Instructions (Addendum)
Take the omeprazole 20 mg every evening and if needed can take in the am Stop the Zantac  If the food sticking gets worse or if you have bleeding then come back immediately  If the memory loss is worsening come back  Come back in 3 months for a check up

## 2012-08-06 NOTE — Assessment & Plan Note (Signed)
Not well controlled with zantac.  No red flags.  Will change to PPI and follow his symptoms

## 2012-08-10 ENCOUNTER — Other Ambulatory Visit: Payer: Self-pay | Admitting: *Deleted

## 2012-08-10 MED ORDER — ATENOLOL 25 MG PO TABS: 25.0000 mg | ORAL_TABLET | Freq: Every day | ORAL | Status: DC

## 2012-09-12 DIAGNOSIS — K259 Gastric ulcer, unspecified as acute or chronic, without hemorrhage or perforation: Secondary | ICD-10-CM

## 2012-09-12 DIAGNOSIS — K222 Esophageal obstruction: Secondary | ICD-10-CM

## 2012-09-12 HISTORY — DX: Esophageal obstruction: K22.2

## 2012-09-12 HISTORY — PX: ESOPHAGEAL DILATION: SHX303

## 2012-09-12 HISTORY — DX: Gastric ulcer, unspecified as acute or chronic, without hemorrhage or perforation: K25.9

## 2012-09-27 ENCOUNTER — Other Ambulatory Visit: Payer: Self-pay | Admitting: Family Medicine

## 2012-10-15 ENCOUNTER — Ambulatory Visit (INDEPENDENT_AMBULATORY_CARE_PROVIDER_SITE_OTHER): Payer: Medicare Other | Admitting: Family Medicine

## 2012-10-15 ENCOUNTER — Encounter: Payer: Self-pay | Admitting: Family Medicine

## 2012-10-15 VITALS — BP 127/70 | HR 66 | Temp 98.2°F | Ht 68.0 in | Wt 194.0 lb

## 2012-10-15 DIAGNOSIS — I10 Essential (primary) hypertension: Secondary | ICD-10-CM

## 2012-10-15 DIAGNOSIS — K219 Gastro-esophageal reflux disease without esophagitis: Secondary | ICD-10-CM

## 2012-10-15 NOTE — Assessment & Plan Note (Signed)
Well controlled continue current medications  

## 2012-10-15 NOTE — Progress Notes (Signed)
  Subjective:    Patient ID: Jacob Norton, male    DOB: 1935/03/13, 78 y.o.   MRN: [id number]  HPI  GERD  Disease Monitoring Typical Symptoms:  Heartburn much improved with PPI    Bleeding (hematemesis/melena):  no  Food sticking:  no  Weight Loss:  no   Medication Monitoring Compliance:  Daily omeprazole wants to take prn   Recent Pneumonia  no    Have attempted taking only PRN no but will now  HYPERTENSION Disease Monitoring Home BP Monitoring not checking Chest pain- no    Dyspnea- no Medications Compliance-  Daily atenolol. Lightheadedness-  no  Edema- no ROS - See HPI  PMH Lab Review   Potassium  Date Value Range Status  05/21/2012 4.0  3.5 - 5.3 mEq/L Final     Sodium  Date Value Range Status  05/21/2012 140  135 - 145 mEq/L Final     Creat  Date Value Range Status  05/21/2012 1.35  0.50 - 1.35 mg/dL Final     Creatinine, Ser  Date Value Range Status  03/29/2011 1.22  0.50 - 1.35 mg/dL Final       Review of Symptoms - see HPI  PMH - Smoking status noted.        Review of Systems     Objective:   Physical Exam  Heart - Regular rate and rhythm.  No murmurs, gallops or rubs.    Lungs:  Normal respiratory effort, chest expands symmetrically. Lungs are clear to auscultation, no crackles or wheezes. Extremities:  No cyanosis, edema, or deformity noted with good range of motion of all major joints.         Assessment & Plan:

## 2012-10-15 NOTE — Assessment & Plan Note (Signed)
Well controlled with PPI.  Will change to as needed

## 2012-10-15 NOTE — Patient Instructions (Addendum)
Take the omeprazole as needed - not every day  You are up to date on all your tests and labs  Come back in 6 months for a check up

## 2012-12-03 ENCOUNTER — Ambulatory Visit (INDEPENDENT_AMBULATORY_CARE_PROVIDER_SITE_OTHER): Payer: Medicare Other | Admitting: Family Medicine

## 2012-12-03 ENCOUNTER — Ambulatory Visit (HOSPITAL_COMMUNITY)
Admission: RE | Admit: 2012-12-03 | Discharge: 2012-12-03 | Disposition: A | Discharge status: Home / Self Care | Payer: Medicare Other | Source: Ambulatory Visit | Attending: Family Medicine | Admitting: Family Medicine

## 2012-12-03 ENCOUNTER — Encounter: Payer: Self-pay | Admitting: Family Medicine

## 2012-12-03 VITALS — BP 117/70 | HR 63 | Temp 98.7°F | Ht 68.0 in | Wt 192.0 lb

## 2012-12-03 DIAGNOSIS — R079 Chest pain, unspecified: Secondary | ICD-10-CM | POA: Insufficient documentation

## 2012-12-03 DIAGNOSIS — K219 Gastro-esophageal reflux disease without esophagitis: Secondary | ICD-10-CM

## 2012-12-03 DIAGNOSIS — I498 Other specified cardiac arrhythmias: Secondary | ICD-10-CM | POA: Insufficient documentation

## 2012-12-03 DIAGNOSIS — I4510 Unspecified right bundle-branch block: Secondary | ICD-10-CM | POA: Insufficient documentation

## 2012-12-03 NOTE — Progress Notes (Signed)
Subjective:    Jacob Norton is a 78 y.o. male who presents to [facility] today with complaints of GERD symptoms:  1.  GERD symptoms:  Patient previously taking Zantac.  Started on Omeprazole as PPI therapy by PCP about 1-2 months ago.  Took 1 pill only at that time.  Experienced relief.  However he noted on bottle that it read "ask your doctor if taking this medicine more than 15 days."  He therefore took no more and was taking Zantac.  Began having symptoms again this past weekend -- brash, burning in chest, not improved with Zantac.  No unintenional weight loss, dysphagia, abdominal pain.  Took Omeprazole 2 days ago and experienced relief.  Wants to know if he can take this long-term.   No pain on exertion/shotness of breath/palpitations.    The following portions of the patient's history were reviewed and updated as appropriate: allergies, current medications, past medical history, family and social history, and problem list. Patient is a nonsmoker.    PMH reviewed.  Past Medical History  Diagnosis Date  . Hypertension   . Hyperlipidemia   . Blood transfusion    Past Surgical History  Procedure Laterality Date  . Prostate surgery    . Colon surgery    . Total knee arthroplasty      left  . Cholecystectomy  03/25/11    Medications reviewed. Current Outpatient Prescriptions  Medication Sig Dispense Refill  . aspirin 81 MG tablet Take 81 mg by mouth daily.        Marland Kitchen atenolol (TENORMIN) 25 MG tablet Take 1 tablet (25 mg total) by mouth daily.  90 tablet  2  . co-enzyme Q-10 30 MG capsule Take 30 mg by mouth 3 (three) times daily.      . diflorasone (PSORCON) 0.05 % ointment APPLY TO AFFECTED AREA EVERY DAY  30 g  2  . Fish Oil OIL daily.        . meclizine (ANTIVERT) 25 MG tablet Take 1 tablet (25 mg total) by mouth 3 (three) times daily as needed.  30 tablet  0  . Multiple Vitamins-Minerals (CENTRUM SILVER PO) Take 1 tablet by mouth daily.        Marland Kitchen omeprazole (PRILOSEC) 20 MG capsule  Take 1 capsule (20 mg total) by mouth daily.  60 capsule  3  . polyethylene glycol (MIRALAX / GLYCOLAX) packet Take 17 g by mouth daily.      . simvastatin (ZOCOR) 80 MG tablet TAKE 1 TABLET BY MOUTH AT BEDTIME  90 tablet  2  . terazosin (HYTRIN) 5 MG capsule TAKE 1 CAPSULE EVERY DAY  90 capsule  3   No current facility-administered medications for this visit.    ROS as above otherwise neg.  No chest pain, palpitations, SOB, Fever, Chills, Abd pain, N/V/D.   Objective:   Physical Exam BP 117/70  Pulse 63  Temp(Src) 98.7 F (37.1 C) (Oral)  Ht 5\' 8"  (1.727 m)  Wt 192 lb (87.091 kg)  BMI 29.2 kg/m2 Gen:  Alert, cooperative patient who appears stated age in no acute distress.  Vital signs reviewed. HEENT: EOMI,  MMM Cardiac:  Regular rate and rhythm without murmur auscultated.  Good S1/S2. Pulm:  Clear to auscultation bilaterally with good air movement.  No wheezes or rales noted.   Abd:  Soft/nondistended/nontender.  Large ventral hernia noted.  Good bowel sounds    No results found for this or any previous visit (from the past 72 hour(s)).

## 2012-12-03 NOTE — Patient Instructions (Signed)
Keep taking the Omeprazole.    You can stop the Zantac.  If you start having any vomiting, nausea, or concerns for weight loss, let us know.

## 2012-12-04 NOTE — Assessment & Plan Note (Signed)
Reassured patient regarding use of PPI. Told him he should discuss with PCP about using this medication for more than 6 - 9 months continuously, but definitely okay to take for longer than 15 days at a time. Recommended stop Zantac.   FU if no improvement.  Of note - clinic staff obtained EKG due to concern for "chest pain" before I saw patient.  Would not have ordered EKG based on symptoms/history.  Patient denied any new or different chest pain with me.  Discussed appropriate use of EKG with staff.

## 2013-01-30 ENCOUNTER — Encounter: Payer: Self-pay | Admitting: Home Health Services

## 2013-02-07 ENCOUNTER — Ambulatory Visit (INDEPENDENT_AMBULATORY_CARE_PROVIDER_SITE_OTHER): Payer: Medicare Other | Admitting: Home Health Services

## 2013-02-07 ENCOUNTER — Encounter: Payer: Self-pay | Admitting: Home Health Services

## 2013-02-07 VITALS — BP 120/81 | HR 78 | Temp 97.2°F | Ht 65.0 in | Wt 197.7 lb

## 2013-02-07 DIAGNOSIS — Z0000 Encounter for general adult medical examination without abnormal findings: Secondary | ICD-10-CM

## 2013-02-07 NOTE — Progress Notes (Signed)
Patient here for annual wellness visit, patient reports: Risk Factors/Conditions needing evaluation or treatment: Pt does not have any new risk factors that need evaluation.  Home Safety: Pt lives with wife.  Report having smoke detectors.  Other Information: Corrective lens: Pt wears corrective lens for reading.  Has had cataracts removed. Dentures: Pt does not have dentures. Has regular dental exams. Memory: Pt reports some memory problems. Patient's Mini Mental Score (recorded in doc. flowsheet): 27  Balance/Gait: Pt reports not falls in the past year.   Balance Abnormal Patient value  Sitting balance    Sit to stand    Attempts to arise    Immediate standing balance    Standing balance    Nudge    Eyes closed- Romberg x dizzy  Tandem stance x unable  Back lean x dizzy  Neck Rotation    360 degree turn    Sitting down     Gait Abnormal Patient value  Initiation of gait    Step length-left    Step length-right x Has had knee surgery  Step height-left    Step height-right x Has had knee surgery  Step symmetry    Step continuity    Path deviation    Trunk movement    Walking stance        Annual Wellness Visit Requirements Recorded Today In  Medical, family, social history Past Medical, Family, Social History Section  Current providers Care team  Current medications Medications  Wt, BP, Ht, BMI Vital signs  Tobacco, alcohol, illicit drug use History  ADL Nurse Assessment  Depression Screening Nurse Assessment  Cognitive impairment Nurse Assessment  Mini Mental Status Document Flowsheet  Fall Risk Fall/Depression  Home Safety Progress Note  End of Life Planning (welcome visit) Social Documentation  Medicare preventative services Progress Note  Risk factors/conditions needing evaluation/treatment Progress Note  Personalized health advice Patient Instructions, goals, letter  Diet & Exercise Social Documentation  Emergency Contact Social Documentation  Seat  Belts Social Documentation  Sun exposure/protection Social Documentation    I have reviewed this visit and discussed with Arlys John and agree with her documentation. CHAMBLISS,MARSHALL L

## 2013-02-07 NOTE — Patient Instructions (Signed)
1. Continue with daily exercises

## 2013-02-15 ENCOUNTER — Encounter: Payer: Self-pay | Admitting: Home Health Services

## 2013-03-20 ENCOUNTER — Ambulatory Visit (INDEPENDENT_AMBULATORY_CARE_PROVIDER_SITE_OTHER): Payer: Medicare Other | Admitting: Family Medicine

## 2013-03-20 ENCOUNTER — Encounter: Payer: Self-pay | Admitting: Family Medicine

## 2013-03-20 VITALS — BP 120/67 | HR 72 | Temp 97.7°F | Ht 65.0 in | Wt 199.0 lb

## 2013-03-20 DIAGNOSIS — R404 Transient alteration of awareness: Secondary | ICD-10-CM

## 2013-03-20 DIAGNOSIS — I10 Essential (primary) hypertension: Secondary | ICD-10-CM

## 2013-03-20 DIAGNOSIS — R400 Somnolence: Secondary | ICD-10-CM

## 2013-03-20 DIAGNOSIS — E782 Mixed hyperlipidemia: Secondary | ICD-10-CM

## 2013-03-20 LAB — BASIC METABOLIC PANEL
CO2: 28 mEq/L (ref 19–32)
Calcium: 9.5 mg/dL (ref 8.4–10.5)
Chloride: 105 mEq/L (ref 96–112)
Creat: 1.14 mg/dL (ref 0.50–1.35)
Glucose, Bld: 108 mg/dL — ABNORMAL HIGH (ref 70–99)
Sodium: 138 mEq/L (ref 135–145)

## 2013-03-20 LAB — CBC
HCT: 42 % (ref 39.0–52.0)
Hemoglobin: 14.6 g/dL (ref 13.0–17.0)
MCH: 28.8 pg (ref 26.0–34.0)
MCHC: 34.8 g/dL (ref 30.0–36.0)
RDW: 13.2 % (ref 11.5–15.5)

## 2013-03-20 NOTE — Assessment & Plan Note (Signed)
Well controlled.  Check bmet

## 2013-03-20 NOTE — Assessment & Plan Note (Signed)
New complaint.  No obvious cause.  No red flags for gi bleed or infection. Given history of gerd and improvement with otc iron will check cbc.

## 2013-03-20 NOTE — Progress Notes (Signed)
  Subjective:    Patient ID: Jacob Norton, male    DOB: [birth year], 78 y.o.   MRN: [id number]  HPI  Drowsy For the last few weeks intermittenly episodes of sleeping more during the day and feeling fatigued.  Has been traveling a lot and is working to sell several businesses,   Started himself on Iron vit d and a combination vitamin tablet for energy.  This seems to have helped and is feeling better No shortness of breath or chest pain with exercise or fever or weight loss or dark stools or edema GERD sympt are well controlled  Review of Symptoms - see HPI  PMH - Smoking status noted.     Review of Systems     Objective:   Physical Exam Alert no acute distress Able to get on exam table easily Heart - Regular rate and rhythm.  No murmurs, gallops or rubs.    Lungs:  Normal respiratory effort, chest expands symmetrically. Lungs are clear to auscultation, no crackles or wheezes. Extremities:  No cyanosis, edema, or deformity noted with good range of motion of all major joints.          Assessment & Plan:

## 2013-03-20 NOTE — Patient Instructions (Addendum)
Glad you are feeling better  Keep taking the iron vitamin c until I send you your blood results  Call me or come in if you are feeling worse or having chest pain or lightheadness or swelling  You should see Dr Eden Emms once a year or so to check your cholesterol OR come in for a fasting cholesterol here in the next month or so  Hope the traveling slows down

## 2013-03-22 ENCOUNTER — Encounter: Payer: Self-pay | Admitting: Family Medicine

## 2013-03-28 ENCOUNTER — Other Ambulatory Visit: Payer: Medicare Other

## 2013-03-28 DIAGNOSIS — E782 Mixed hyperlipidemia: Secondary | ICD-10-CM

## 2013-03-28 LAB — LIPID PANEL
LDL Cholesterol: 73 mg/dL (ref 0–99)
Triglycerides: 105 mg/dL (ref <150)
VLDL: 21 mg/dL (ref 0–40)

## 2013-03-28 NOTE — Progress Notes (Signed)
FLP DONE TODAY Vertie Dibbern 

## 2013-03-29 ENCOUNTER — Encounter: Payer: Self-pay | Admitting: Family Medicine

## 2013-05-06 ENCOUNTER — Telehealth: Payer: Self-pay | Admitting: Family Medicine

## 2013-05-06 NOTE — Telephone Encounter (Signed)
Pt would like a nurse to call him concerning his stool. JW

## 2013-05-06 NOTE — Telephone Encounter (Signed)
Pt reports last Thursday upon traveling he had lower abdoman pain with no BM -took several OTC meds and had stool in the middle of the night but stool was black. Stool is still dark but not as black. Recommended pt come in for appointment tomorrow - denies immediate pain, nausea, vomiting or diarrhea. Wyatt Haste, RN-BSN

## 2013-05-07 ENCOUNTER — Encounter: Payer: Self-pay | Admitting: Family Medicine

## 2013-05-07 ENCOUNTER — Ambulatory Visit (INDEPENDENT_AMBULATORY_CARE_PROVIDER_SITE_OTHER): Payer: Medicare Other | Admitting: Family Medicine

## 2013-05-07 VITALS — BP 87/60 | HR 89 | Ht 65.0 in | Wt 194.0 lb

## 2013-05-07 DIAGNOSIS — R195 Other fecal abnormalities: Secondary | ICD-10-CM

## 2013-05-07 DIAGNOSIS — K5900 Constipation, unspecified: Secondary | ICD-10-CM

## 2013-05-07 LAB — CBC WITH DIFFERENTIAL/PLATELET
HCT: 41.6 % (ref 39.0–52.0)
Hemoglobin: 14.6 g/dL (ref 13.0–17.0)
Lymphocytes Relative: 16 % (ref 12–46)
Lymphs Abs: 0.9 10*3/uL (ref 0.7–4.0)
Monocytes Absolute: 0.5 10*3/uL (ref 0.1–1.0)
Monocytes Relative: 8 % (ref 3–12)
Neutro Abs: 4.2 10*3/uL (ref 1.7–7.7)
Neutrophils Relative %: 73 % (ref 43–77)
RBC: 4.92 MIL/uL (ref 4.22–5.81)

## 2013-05-07 NOTE — Assessment & Plan Note (Addendum)
Patient is currently asymptomatic with negative Hemoccult, abdominal pain is resolved, he has a remote history of colon cancer status post colon resection, orthostatics are positive today -Will check CBC to monitor for anemia -Patient referred to colo-rectal surgery as he is not established care with GI/rectal surgery in [facility]

## 2013-05-07 NOTE — Patient Instructions (Addendum)
Check lab work on the way out today, CBC, Dr. Randolm Idol will call you with the results  A referral has been made to Colorectal Surgery for follow up of your colon cancer. You will receive a call in the next few days in regards to this.

## 2013-05-07 NOTE — Progress Notes (Signed)
  Subjective:    Patient ID: Jacob Norton, male    DOB: 08-17-1935, 78 y.o.   MRN: [id number]  HPI 78 year old male presents for evaluation of dark stools, 5 days ago patient took milk of magnesium for constipation, he had not had a bowel movement at that time for over 3 days, patient subsequently had multiple episodes of dark stool, denies frank blood, no streaking of the stool with blood, he had abdominal pain that was related to constipation and relieved with bowel movement, the abdominal pain was in bilateral lower quadrants, no nausea, no emesis, she's had slightly darker stools over the past few days which have gradually returned to normal color, he has been tolerating his diet, no fever, no chills, she also said some increased fatigue recently, he does have occasional dizziness which she relates to his Mnire's disease, denies orthostatic hypotension when going from a seated to standing position, no loss of consciousness or syncope  Surgery history-he has a history of colon cancer that required partial colon resection approximately 12 years ago, patient states that he had colonoscopy approximately 4 years ago, per records in Epic his last colonoscopy was in 2012 however records are not available for review of systems performed at an outside facility, patient does not follow with a GI or colorectal surgeon at this time   Review of Systems  Constitutional: Positive for fatigue. Negative for fever, chills and unexpected weight change.  Respiratory: Negative for cough and wheezing.   Cardiovascular: Negative for chest pain.  Gastrointestinal: Positive for abdominal pain, constipation and blood in stool. Negative for nausea, vomiting, diarrhea, abdominal distention, anal bleeding and rectal pain.       Objective:   Physical Exam Vitals: Reviewed, patient did show evidence of orthostatic hypotension General: Pleasant male, no acute distress, Caucasian, obese HEENT: Normocephalic, pupils equal  round reactive to light, moist mucous membranes, neck was supple Cardiac: Regular rate and rhythm, S1 and S2 present, no murmurs, no heaves or thrills Respiratory: Clear to patient bilaterally, good effort Abdomen: Soft, mild tenderness in the bilateral lower quadrants, bowel sounds are present, scar present from previous colon resection with evidence of small ventral hernia Rectal: No external hemorrhoids or mass were noted, prostate was slightly enlarged without nodules, no internal rectal masses were palpated, patient had dark stool without evidence of frank blood, Hemoccult was obtained and was negative for blood in the stool       Assessment & Plan:

## 2013-05-07 NOTE — Assessment & Plan Note (Signed)
Constipation is resolved -Patient was counseled to use MiraLax daily -He may use milk of magnesia intermittently as needed for constipation

## 2013-05-08 ENCOUNTER — Encounter: Payer: Self-pay | Admitting: Family Medicine

## 2013-06-07 ENCOUNTER — Encounter: Payer: Self-pay | Admitting: Gastroenterology

## 2013-06-11 ENCOUNTER — Other Ambulatory Visit: Payer: Self-pay

## 2013-06-11 MED ORDER — ATENOLOL 25 MG PO TABS: 25.0000 mg | ORAL_TABLET | Freq: Every day | ORAL | Status: DC

## 2013-06-16 ENCOUNTER — Other Ambulatory Visit: Payer: Self-pay | Admitting: Family Medicine

## 2013-07-10 ENCOUNTER — Ambulatory Visit (INDEPENDENT_AMBULATORY_CARE_PROVIDER_SITE_OTHER): Payer: Medicare Other | Admitting: Gastroenterology

## 2013-07-10 ENCOUNTER — Other Ambulatory Visit (INDEPENDENT_AMBULATORY_CARE_PROVIDER_SITE_OTHER): Payer: Medicare Other

## 2013-07-10 ENCOUNTER — Encounter: Payer: Self-pay | Admitting: Gastroenterology

## 2013-07-10 VITALS — BP 108/66 | HR 76 | Ht 65.0 in | Wt 196.8 lb

## 2013-07-10 DIAGNOSIS — Z860100 Personal history of colon polyps, unspecified: Secondary | ICD-10-CM | POA: Insufficient documentation

## 2013-07-10 DIAGNOSIS — K5900 Constipation, unspecified: Secondary | ICD-10-CM

## 2013-07-10 DIAGNOSIS — R1310 Dysphagia, unspecified: Secondary | ICD-10-CM | POA: Insufficient documentation

## 2013-07-10 DIAGNOSIS — R195 Other fecal abnormalities: Secondary | ICD-10-CM

## 2013-07-10 DIAGNOSIS — R5381 Other malaise: Secondary | ICD-10-CM

## 2013-07-10 DIAGNOSIS — K439 Ventral hernia without obstruction or gangrene: Secondary | ICD-10-CM | POA: Insufficient documentation

## 2013-07-10 LAB — CBC WITH DIFFERENTIAL/PLATELET
Basophils Relative: 0.6 % (ref 0.0–3.0)
Eosinophils Relative: 2.8 % (ref 0.0–5.0)
HCT: 42.5 % (ref 39.0–52.0)
Lymphs Abs: 1.1 10*3/uL (ref 0.7–4.0)
MCV: 86.5 fl (ref 78.0–100.0)
Monocytes Absolute: 0.5 10*3/uL (ref 0.1–1.0)
Monocytes Relative: 8.8 % (ref 3.0–12.0)
Neutrophils Relative %: 70.7 % (ref 43.0–77.0)
RBC: 4.92 Mil/uL (ref 4.22–5.81)
RDW: 13 % (ref 11.5–14.6)
WBC: 6.2 10*3/uL (ref 4.5–10.5)

## 2013-07-10 LAB — COMPREHENSIVE METABOLIC PANEL
AST: 20 U/L (ref 0–37)
Albumin: 4.4 g/dL (ref 3.5–5.2)
Alkaline Phosphatase: 79 U/L (ref 39–117)
BUN: 24 mg/dL — ABNORMAL HIGH (ref 6–23)
CO2: 27 mEq/L (ref 19–32)
GFR: 58 mL/min — ABNORMAL LOW (ref >60.00)
Glucose, Bld: 99 mg/dL (ref 70–99)
Potassium: 4.3 mEq/L (ref 3.5–5.1)
Sodium: 139 mEq/L (ref 135–145)
Total Protein: 6.7 g/dL (ref 6.0–8.3)

## 2013-07-10 NOTE — Assessment & Plan Note (Signed)
Solid food dysphagia-rule out esophageal stricture  Recommendations #1 upper endoscopy with dilation as indicated

## 2013-07-10 NOTE — Assessment & Plan Note (Signed)
Fatigue is a mild problem but new-onset.  Because of a history of dark stools I will check Hemoccults and repeat a CBC and comprehensive metabolic profile

## 2013-07-10 NOTE — Assessment & Plan Note (Signed)
In view of his age routine colonoscopy will not be performed

## 2013-07-10 NOTE — Patient Instructions (Signed)

## 2013-07-10 NOTE — Progress Notes (Signed)
History of Present Illness: This 78 year old white male referred for evaluation of dysphagia.  He has intermittent dysphagia to solids.  He denies pyrosis.  Several months ago he had episodes of passing solid but very dark stools.  This lasted for 3-4 days.  Hemoglobin in August, 2014 was normal.  He does complain of fatigue.  Colonoscopy in 2012 was pertinent for an adenomatous polyp.  Colon cancer was resected in 1991. He suffers from chronic constipation which is well-controlled with MiraLax.    Past Medical History  Diagnosis Date  . Hypertension   . Hyperlipidemia   . Blood transfusion   . Prostate cancer    Past Surgical History  Procedure Laterality Date  . Prostate surgery    . Colon surgery  1991  . Total knee arthroplasty      left  . Cholecystectomy  03/25/11  . Appendectomy  1965  . Coronary artery bypass graft  1994  . Hernia repair  1965  . Coronary angioplasty with stent placement  1994   family history is not on file. Current Outpatient Prescriptions  Medication Sig Dispense Refill  . aspirin 81 MG tablet Take 81 mg by mouth daily.        Marland Kitchen atenolol (TENORMIN) 25 MG tablet Take 1 tablet (25 mg total) by mouth daily.  90 tablet  0  . Cholecalciferol (VITAMIN D3) 2000 UNITS capsule Take 2,000 Units by mouth daily.      Marland Kitchen co-enzyme Q-10 30 MG capsule Take 30 mg by mouth 3 (three) times daily.      . cyanocobalamin 100 MCG tablet Take 100 mcg by mouth daily.      . diflorasone (PSORCON) 0.05 % ointment APPLY TO AFFECTED AREA EVERY DAY  30 g  2  . Fish Oil OIL daily.        . lansoprazole (PREVACID) 15 MG capsule Take 15 mg by mouth daily.      . meclizine (ANTIVERT) 25 MG tablet Take 1 tablet (25 mg total) by mouth 3 (three) times daily as needed.  30 tablet  0  . Multiple Vitamins-Minerals (CENTRUM SILVER PO) Take 1 tablet by mouth daily.        . polyethylene glycol (MIRALAX / GLYCOLAX) packet Take 17 g by mouth daily.      . simvastatin (ZOCOR) 80 MG tablet TAKE 1  TABLET BY MOUTH AT BEDTIME  90 tablet  2  . terazosin (HYTRIN) 5 MG capsule TAKE 1 CAPSULE EVERY DAY  90 capsule  3   No current facility-administered medications for this visit.   Allergies as of 07/10/2013  . (No Known Allergies)    reports that he quit smoking about 17 years ago. His smoking use included Cigarettes. He smoked 0.00 packs per day. He has never used smokeless tobacco. He reports that he drinks about 0.5 ounces of alcohol per week. He reports that he does not use illicit drugs.     Review of Systems: Pertinent positive and negative review of systems were noted in the above HPI section. All other review of systems were otherwise negative.  Vital signs were reviewed in today's medical record Physical Exam: General: Well developed , well nourished, no acute distress Skin: anicteric Head: Normocephalic and atraumatic Eyes:  sclerae anicteric, EOMI Ears: Normal auditory acuity Mouth: No deformity or lesions Neck: Supple, no masses or thyromegaly Lungs: Clear throughout to auscultation Heart: Regular rate and rhythm; no murmurs, rubs or bruits Abdomen: Soft, non tender and non distended. No  masses, hepatosplenomegaly or hernias noted. Normal Bowel sounds.  There is a reducible midline ventral hernia Rectal:deferred Musculoskeletal: Symmetrical with no gross deformities  Skin: No lesions on visible extremities Pulses:  Normal pulses noted Extremities: No clubbing, cyanosis, edema or deformities noted Neurological: Alert oriented x 4, grossly nonfocal Cervical Nodes:  No significant cervical adenopathy Inguinal Nodes: No significant inguinal adenopathy Psychological:  Alert and cooperative. Normal mood and affect

## 2013-07-10 NOTE — Assessment & Plan Note (Signed)
Plan to continue MiraLax.

## 2013-07-16 ENCOUNTER — Ambulatory Visit (AMBULATORY_SURGERY_CENTER): Payer: Medicare Other | Admitting: Gastroenterology

## 2013-07-16 ENCOUNTER — Encounter: Payer: Self-pay | Admitting: Gastroenterology

## 2013-07-16 ENCOUNTER — Other Ambulatory Visit (INDEPENDENT_AMBULATORY_CARE_PROVIDER_SITE_OTHER): Payer: Medicare Other

## 2013-07-16 VITALS — BP 129/71 | HR 60 | Temp 97.2°F | Resp 17 | Ht 65.0 in | Wt 196.0 lb

## 2013-07-16 DIAGNOSIS — R195 Other fecal abnormalities: Secondary | ICD-10-CM

## 2013-07-16 DIAGNOSIS — K2960 Other gastritis without bleeding: Secondary | ICD-10-CM

## 2013-07-16 DIAGNOSIS — R1310 Dysphagia, unspecified: Secondary | ICD-10-CM

## 2013-07-16 LAB — FECAL OCCULT BLOOD, IMMUNOCHEMICAL: Fecal Occult Bld: NEGATIVE

## 2013-07-16 MED ORDER — SODIUM CHLORIDE 0.9 % IV SOLN: 500.0000 mL | INTRAVENOUS | Status: DC

## 2013-07-16 NOTE — Progress Notes (Signed)
Patient did not experience any of the following events: a burn prior to discharge; a fall within the facility; wrong site/side/patient/procedure/implant event; or a hospital transfer or hospital admission upon discharge from the facility. (G8907) Patient did not have preoperative order for IV antibiotic SSI prophylaxis. (G8918)  

## 2013-07-16 NOTE — Progress Notes (Signed)
Called to room to assist during endoscopic procedure.  Patient ID and intended procedure confirmed with present staff. Received instructions for my participation in the procedure from the performing physician.  

## 2013-07-16 NOTE — Op Note (Signed)
[facility] [address] [place], [address]   ENDOSCOPY PROCEDURE REPORT  PATIENT: Jacob, Norton  MR#: [id number] BIRTHDATE: 1935/06/22 , 78  yrs. old GENDER: Male ENDOSCOPIST: Louis Meckel, MD REFERRED BY:  Pearlean Brownie, MD PROCEDURE DATE:  07/16/2013 PROCEDURE:  EGD w/ biopsy and Maloney dilation of esophagus ASA CLASS:     Class II INDICATIONS:  Dysphagia. MEDICATIONS: MAC sedation, administered by CRNA and propofol (Diprivan) 150mg  IV TOPICAL ANESTHETIC: Cetacaine Spray , simethacone  DESCRIPTION OF PROCEDURE: After the risks benefits and alternatives of the procedure were thoroughly explained, informed consent was obtained.  The LB [id number] endoscope was introduced through the mouth and advanced to the third portion of the duodenum. Without limitations.  The instrument was slowly withdrawn as the mucosa was fully examined.      There was a moderate stricture at the GE junction.  The 9.8 mm gastroscope easily traversed the stricture.  In the gastric antrum there were 2 slightly enlarged folds with superficial erosions. Biopsies were taken.   The remainder of the upper endoscopy exam was otherwise normal.  Retroflexed views revealed no abnormalities. The scope was then withdrawn from the patient.  A #32 Jerene Dilling dilator was passed with moderate resistance.  There was no heme.  COMPLICATIONS: There were no complications. ENDOSCOPIC IMPRESSION: 1.   Esophageal stricture-status post Maloney dilation 2.  gastric erosions  RECOMMENDATIONS: 1.  repeat dilatation as needed 2.  continue Prevacid REPEAT EXAM:  eSigned:  Louis Meckel, MD 07/16/2013 9:55 AM   CC:

## 2013-07-16 NOTE — Progress Notes (Signed)
  [facility] Anesthesia Post-op Note  Patient: Jacob Norton  Procedure(s) Performed: endoscopy with dilatation  Patient Location: [facility] - Recovery Area  Anesthesia Type: Deep Sedation/Propofol  Level of Consciousness: awake, oriented and patient cooperative  Airway and Oxygen Therapy: Patient Spontanous Breathing  Post-op Pain: none  Post-op Assessment:  Post-op Vital signs reviewed, Patient's Cardiovascular Status Stable, Respiratory Function Stable, Patent Airway, No signs of Nausea or vomiting and Pain level controlled  Post-op Vital Signs: Reviewed and stable  Complications: No apparent anesthesia complications  Kitara Hebb E 9:54 AM

## 2013-07-16 NOTE — Patient Instructions (Signed)
YOU HAD AN ENDOSCOPIC PROCEDURE TODAY AT THE [facility]: Refer to the procedure report that was given to you for any specific questions about what was found during the examination.  If the procedure report does not answer your questions, please call your gastroenterologist to clarify.  If you requested that your care partner not be given the details of your procedure findings, then the procedure report has been included in a sealed envelope for you to review at your convenience later.  YOU SHOULD EXPECT: Some feelings of bloating in the abdomen. Passage of more gas than usual.  Walking can help get rid of the air that was put into your GI tract during the procedure and reduce the bloating. If you had a lower endoscopy (such as a colonoscopy or flexible sigmoidoscopy) you may notice spotting of blood in your stool or on the toilet paper. If you underwent a bowel prep for your procedure, then you may not have a normal bowel movement for a few days.  DIET: See dilation diet handout -Drink plenty of fluids but you should avoid alcoholic beverages for 24 hours.  ACTIVITY: Your care partner should take you home directly after the procedure.  You should plan to take it easy, moving slowly for the rest of the day.  You can resume normal activity the day after the procedure however you should NOT DRIVE or use heavy machinery for 24 hours (because of the sedation medicines used during the test).    SYMPTOMS TO REPORT IMMEDIATELY: A gastroenterologist can be reached at any hour.  During normal business hours, 8:30 AM to 5:00 PM Monday through Friday, call [phone].  After hours and on weekends, please call the GI answering service at [phone] who will take a message and have the physician on call contact you.   Following upper endoscopy (EGD)  Vomiting of blood or coffee ground material  New chest pain or pain under the shoulder blades  Painful or persistently difficult swallowing  New  shortness of breath  Fever of 100F or higher  Black, tarry-looking stools  FOLLOW UP: If any biopsies were taken you will be contacted by phone or by letter within the next 1-3 weeks.  Call your gastroenterologist if you have not heard about the biopsies in 3 weeks.  Our staff will call the home number listed on your records the next business day following your procedure to check on you and address any questions or concerns that you may have at that time regarding the information given to you following your procedure. This is a courtesy call and so if there is no answer at the home number and we have not heard from you through the emergency physician on call, we will assume that you have returned to your regular daily activities without incident.  SIGNATURES/CONFIDENTIALITY: You and/or your care partner have signed paperwork which will be entered into your electronic medical record.  These signatures attest to the fact that that the information above on your After Visit Summary has been reviewed and is understood.  Full responsibility of the confidentiality of this discharge information lies with you and/or your care-partner.  Dilation diet, stricture-handouts given  Continue prevacid  Repeat dilation as needed.

## 2013-07-17 ENCOUNTER — Telehealth: Payer: Self-pay | Admitting: *Deleted

## 2013-07-17 NOTE — Progress Notes (Signed)
Quick Note:  Please inform the patient that hemeoccult was normal and to continue current plan of action ______ 

## 2013-07-17 NOTE — Telephone Encounter (Signed)
  Follow up Call-  Call back number 07/16/2013  Post procedure Call Back phone  # [phone]  Permission to leave phone message Yes     Patient questions:  Do you have a fever, pain , or abdominal swelling? no Pain Score  0 *  Have you tolerated food without any problems? yes  Have you been able to return to your normal activities? yes  Do you have any questions about your discharge instructions: Diet   no Medications  no Follow up visit  no  Do you have questions or concerns about your Care? no  Actions: * If pain score is 4 or above: No action needed, pain <4.

## 2013-07-23 ENCOUNTER — Encounter: Payer: Self-pay | Admitting: Gastroenterology

## 2013-08-19 ENCOUNTER — Ambulatory Visit (INDEPENDENT_AMBULATORY_CARE_PROVIDER_SITE_OTHER): Payer: Medicare Other | Admitting: Family Medicine

## 2013-08-19 ENCOUNTER — Encounter: Payer: Self-pay | Admitting: Family Medicine

## 2013-08-19 VITALS — BP 114/67 | HR 78 | Temp 98.8°F | Ht 65.0 in | Wt 198.0 lb

## 2013-08-19 DIAGNOSIS — R21 Rash and other nonspecific skin eruption: Secondary | ICD-10-CM

## 2013-08-19 NOTE — Progress Notes (Signed)
Subjective:     Patient ID: Jacob Norton, male   DOB: 01/21/1935, 78 y.o.   MRN: [id number]  HPI Comments: He reports rash on his upper chest and neck that began ~3 days ago. The rash was associated with itching, and improved with a steroid cream that he had received several years ago. He denied any SOB or difficulty swallowing with the rash, and it has completely resolved today. He denies any new contacts (lotions, soap, etc), outdoor exposure, or viral symptoms. He admits to eating new health foods with his son. Denies any new medications.     Review of Systems  Constitutional: Negative for fever and chills.  HENT: Negative for congestion, facial swelling, postnasal drip, rhinorrhea, sinus pressure, sneezing, sore throat and trouble swallowing.   Respiratory: Negative for apnea, choking, chest tightness, shortness of breath, wheezing and stridor.   Cardiovascular: Positive for chest pain.  Skin: Positive for rash.       Objective:   Physical Exam  Constitutional: He appears well-developed and well-nourished.  Eyes: Conjunctivae and EOM are normal. Pupils are equal, round, and reactive to light.  Cardiovascular: Normal rate and regular rhythm.   Pulmonary/Chest: Effort normal and breath sounds normal.  Skin:  Mild blotchy erythema on upper chest and neck which.    Assessment/Plan:      See Problem Focused Assessment & Plan

## 2013-08-19 NOTE — Assessment & Plan Note (Signed)
Rash resolved with use of previous steroid cream - No further treatment needed

## 2013-08-19 NOTE — Patient Instructions (Signed)
It was great seeing you today.   1. No treatment is currently needed for your rash as it is improving.  2. If it returns you can take benadryl for itching, or continue to use your steroid cream 3. Call/return to clinic if you have trouble breathing, swallowing or new/worsening symptoms   Please bring all your medications to every doctors visit  Sign up for My Chart to have easy access to your labs results, and communication with your Primary care physician.   Please check-out at the front desk before leaving the clinic.   I look forward to talking with you again at our next visit. If you have any questions or concerns before then, please call the clinic at [phone].  Take Care,   Dr Wenda Low

## 2013-08-28 ENCOUNTER — Other Ambulatory Visit: Payer: Self-pay | Admitting: Family Medicine

## 2013-09-02 ENCOUNTER — Other Ambulatory Visit: Payer: Self-pay | Admitting: Cardiovascular Disease

## 2013-09-24 ENCOUNTER — Encounter: Payer: Self-pay | Admitting: Family Medicine

## 2013-09-24 ENCOUNTER — Ambulatory Visit (INDEPENDENT_AMBULATORY_CARE_PROVIDER_SITE_OTHER): Payer: Medicare Other | Admitting: Family Medicine

## 2013-09-24 VITALS — BP 124/84 | HR 85 | Temp 98.1°F | Ht 65.0 in | Wt 197.0 lb

## 2013-09-24 DIAGNOSIS — R21 Rash and other nonspecific skin eruption: Secondary | ICD-10-CM

## 2013-09-24 NOTE — Progress Notes (Signed)
Family Medicine Office Visit Note   Subjective:   Patient ID: Jacob Norton, male  DOB: 02-Dec-1934, 79 y.o.. MRN: [id number]   Pt that comes today for same-day appointment complaining of rash in his neck for about couple of weeks. He had this in the past and has used OTC hydrocortisone with good response.  Patient reports that his mold to in his apartment and is requesting a letter from [place] stating that he is allergic to mold in order to be moved. He denies any other skin rashes, shortness of breath, wheezing, or other systemic symptoms.   Review of Systems:  Per HPI, also denies fevers, chills, nausea or headaches.   Objective:   Physical Exam: Gen:  NAD HEENT: Moist mucous membranes. Neck supple no adenopathies. CV: Regular rate and rhythm, no murmurs rubs or gallops PULM: Clear to auscultation bilaterally. No wheezes/rales/rhonchi EXT: No edema Neuro: Alert and oriented x3. No focalization Skin: Maculopapular erythematous rash present in the anterior portion of his chest. No vesicles, no surrounded edema.   Assessment & Plan:

## 2013-09-24 NOTE — Patient Instructions (Signed)
Continue using the hydrocortisone cream twice a day on the neck until your lesions improve,  then stop and only use moisturizing lotion. To better evaluate your condition and determining possible causes of your allergies it is recommended that you followup with your primary doctor for possible referral to allergist.

## 2013-09-24 NOTE — Assessment & Plan Note (Signed)
Rash present only in the anterior portion at the base of his neck. Had resolved in the past with steroid cream. No respiratory symptoms.  Recommended short course of topical steroid and followup with his primary doctor for possible referral to allergist. We'll defer letter after allergy testing in order to truly determine that this skin issue is due to specific mold allergy.

## 2013-09-30 ENCOUNTER — Other Ambulatory Visit: Payer: Self-pay | Admitting: Cardiovascular Disease

## 2013-09-30 ENCOUNTER — Other Ambulatory Visit: Payer: Self-pay | Admitting: Family Medicine

## 2013-10-02 ENCOUNTER — Other Ambulatory Visit: Payer: Self-pay | Admitting: Cardiovascular Disease

## 2013-10-03 ENCOUNTER — Telehealth: Payer: Self-pay | Admitting: *Deleted

## 2013-10-03 NOTE — Telephone Encounter (Signed)
Late entry. Called pharmacy and asked that they cancel the 15 refills that I mistakenly sent. It was supposed to be no additional refills as the patient must keep appointment for further refills. Pharmacist states that he will update this in their system.

## 2013-10-09 ENCOUNTER — Encounter: Payer: Self-pay | Admitting: Family Medicine

## 2013-10-09 ENCOUNTER — Encounter: Payer: Self-pay | Admitting: Cardiovascular Disease

## 2013-10-09 ENCOUNTER — Ambulatory Visit (INDEPENDENT_AMBULATORY_CARE_PROVIDER_SITE_OTHER): Payer: Medicare Other | Admitting: Family Medicine

## 2013-10-09 VITALS — BP 110/66 | HR 73 | Temp 97.6°F | Ht 65.0 in | Wt 197.0 lb

## 2013-10-09 DIAGNOSIS — E782 Mixed hyperlipidemia: Secondary | ICD-10-CM

## 2013-10-09 DIAGNOSIS — I10 Essential (primary) hypertension: Secondary | ICD-10-CM

## 2013-10-09 DIAGNOSIS — R413 Other amnesia: Secondary | ICD-10-CM

## 2013-10-09 NOTE — Progress Notes (Signed)
   Subjective:    Patient ID: Jacob Norton, male    DOB: September 22, 1934, 79 y.o.   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  Neither his son or wife feel this is a problem.  No getting lost or interfering with his function.   No weakness or loss of vision.  No headache or fallings.   No new medications or alcohol.     HYPERTENSION Disease Monitoring Home BP Monitoring yes usually around 130/70 Chest pain- no    Dyspnea- no Medications Compliance-  Daily atenolol. Lightheadedness-  no  Edema- no ROS - See HPI  HYPERLIPIDEMIA Symptoms Chest pain on exertion:  no   Leg claudication:   no Medications: Compliance- daily simvastatin 40 mg Right upper quadrant pain- no  Muscle aches- no   HYPERLIPIDEMIA Symptoms Chest pain on exertion:  no   Leg claudication:   no Medications: Compliance- daily 1/2 simva Right upper quadrant pain- no  Muscle aches- no  PMH Lab Review   Potassium  Date Value Range Status  07/10/2013 4.3  3.5 - 5.1 mEq/L Final     Sodium  Date Value Range Status  07/10/2013 139  135 - 145 mEq/L Final     Creat  Date Value Range Status  03/20/2013 1.14  0.50 - 1.35 mg/dL Final     Creatinine, Ser  Date Value Range Status  07/10/2013 1.3  0.4 - 1.5 mg/dL Final        Review of Systems     Objective:   Physical Exam Alert no acute distress Knows his medications and mostly why he takes them Can do serial 7s without problems 3/3 objects at 5 minutes Remembers to ask how to get set up for MyChart  Heart - Regular rate and rhythm.  No murmurs, gallops or rubs.    Lungs:  Normal respiratory effort, chest expands symmetrically. Lungs are clear to auscultation, no crackles or wheezes. Extremities:  No cyanosis, edema, or deformity noted with good range of motion of all major joints.   Skin - 2 sebks approximately 1 cm on left anterior chest without irritation        Assessment & Plan:

## 2013-10-09 NOTE — Patient Instructions (Signed)
I think your memory is good  Consider [facility] as a continuing care community  Let me know if you need a letter for leaving your apartment  If you have a lot of lightheadness then call me  Continue to take simvastatin 1/2 tab daily  Come back in 6 months or as needed

## 2013-10-10 ENCOUNTER — Ambulatory Visit (INDEPENDENT_AMBULATORY_CARE_PROVIDER_SITE_OTHER): Payer: Medicare Other | Admitting: *Deleted

## 2013-10-10 VITALS — BP 140/82 | HR 66

## 2013-10-10 DIAGNOSIS — I10 Essential (primary) hypertension: Secondary | ICD-10-CM

## 2013-10-10 NOTE — Progress Notes (Signed)
Patient here today requesting BP check.  Checked his BP at home this morning and BP--111/43 with wrist cuff.  Checked here in office--140/82 (R) & P--66.  Will route info to Dr. Erin Hearing.  Burna Forts, BSN, RN-BC

## 2013-10-10 NOTE — Assessment & Plan Note (Signed)
No evidence of memory impairment on exam.  Likely mild age related and anxiety.  Reassured and continue to monitor

## 2013-10-10 NOTE — Assessment & Plan Note (Signed)
Continue 40 mg simvastatin and check FLP next visit

## 2013-10-10 NOTE — Assessment & Plan Note (Signed)
Well controlled continue current medications May need to decrease if has lightheadness

## 2013-10-11 ENCOUNTER — Ambulatory Visit (INDEPENDENT_AMBULATORY_CARE_PROVIDER_SITE_OTHER): Payer: Medicare Other | Admitting: Cardiovascular Disease

## 2013-10-11 ENCOUNTER — Encounter: Payer: Self-pay | Admitting: Cardiovascular Disease

## 2013-10-11 VITALS — BP 120/79 | HR 67 | Ht 65.0 in | Wt 198.0 lb

## 2013-10-11 DIAGNOSIS — R42 Dizziness and giddiness: Secondary | ICD-10-CM

## 2013-10-11 DIAGNOSIS — R413 Other amnesia: Secondary | ICD-10-CM

## 2013-10-11 DIAGNOSIS — I2510 Atherosclerotic heart disease of native coronary artery without angina pectoris: Secondary | ICD-10-CM

## 2013-10-11 NOTE — Assessment & Plan Note (Signed)
Gets a few episodes still Antivert effective Salt seems to set it off

## 2013-10-11 NOTE — Progress Notes (Signed)
Patient ID: Jacob Norton, male   DOB: 01/17/1935, 79 y.o.   MRN: [id number] Jacob Norton is seen today for F/U. He has distant history of CAD with CABG in the 80's. he had a normal myovue in 4/10 and has normal EF. He has had a rough 2011 with his son being killed and his wife dying a month of chronic lung disease. He denies SSCP, palpitations, dyspnea or syncope. He still has 3 children living but gets bored at night. He has been compliant with his meds and is sedantary except for walking He is tolerating 40 mg of zocor with no leg pain He has downsized and moved to a town home.    ROS: Denies fever, malais, weight loss, blurry vision, decreased visual acuity, cough, sputum, SOB, hemoptysis, pleuritic pain, palpitaitons, heartburn, abdominal pain, melena, lower extremity edema, claudication, or rash.  All other systems reviewed and negative  General: Affect appropriate Healthy:  appears stated age 14: normal Neck supple with no adenopathy JVP normal no bruits no thyromegaly Lungs clear with no wheezing and good diaphragmatic motion Heart:  S1/S2 no murmur, no rub, gallop or click PMI normal Abdomen: benighn, BS positve, no tenderness, no AAA no bruit.  No HSM or HJR Distal pulses intact with no bruits No edema Neuro non-focal Skin warm and dry No muscular weakness   Current Outpatient Prescriptions  Medication Sig Dispense Refill  . aspirin 81 MG tablet Take 81 mg by mouth daily.        Marland Kitchen atenolol (TENORMIN) 25 MG tablet TAKE 1 TABLET (25 MG TOTAL) BY MOUTH DAILY.  30 tablet  15  . Cholecalciferol (VITAMIN D3) 2000 UNITS capsule Take 2,000 Units by mouth daily.      Marland Kitchen co-enzyme Q-10 30 MG capsule Take 30 mg by mouth 3 (three) times daily.      . cyanocobalamin 100 MCG tablet Take 100 mcg by mouth daily.      . diflorasone (PSORCON) 0.05 % ointment APPLY TO AFFECTED AREA EVERY DAY  30 g  2  . Fish Oil OIL daily.        . lansoprazole (PREVACID) 15 MG capsule Take 15 mg by mouth daily.       . meclizine (ANTIVERT) 25 MG tablet TAKE AS NEEDED  30 tablet  2  . Multiple Vitamins-Minerals (CENTRUM SILVER PO) Take 1 tablet by mouth daily.        . polyethylene glycol (MIRALAX / GLYCOLAX) packet Take 17 g by mouth daily.      . simvastatin (ZOCOR) 80 MG tablet TAKE 40 MG TABLET BY MOUTH AT BEDTIME      . terazosin (HYTRIN) 5 MG capsule TAKE 1 CAPSULE EVERY DAY  90 capsule  3   No current facility-administered medications for this visit.    Allergies  Review of patient's allergies indicates no known allergies.  Electrocardiogram: NSR rate 67 LAD ICRBBB poor R wave progression   Assessment and Plan

## 2013-10-11 NOTE — Assessment & Plan Note (Signed)
Well controlled.  Continue current medications and low sodium Dash type diet.    

## 2013-10-11 NOTE — Assessment & Plan Note (Signed)
Stable with no angina and good activity level.  Continue medical Rx  

## 2013-10-11 NOTE — Patient Instructions (Signed)
Your physician wants you to follow-up in: YEAR WITH DR NISHAN  You will receive a reminder letter in the mail two months in advance. If you don't receive a letter, please call our office to schedule the follow-up appointment.  Your physician recommends that you continue on your current medications as directed. Please refer to the Current Medication list given to you today. 

## 2013-10-11 NOTE — Assessment & Plan Note (Signed)
Some dementia probably brought on my wife's death and living alone Stable Son Jacob Norton is local and supportive

## 2013-10-21 ENCOUNTER — Telehealth: Payer: Self-pay | Admitting: Family Medicine

## 2013-10-21 NOTE — Telephone Encounter (Signed)
Patient states he believed that he forgot to take his HTN meds on Friday. Ever since, his BP has been elevated and would like to know if he needs to come in for a BP check just continue taking meds as prescribed.

## 2013-10-21 NOTE — Telephone Encounter (Signed)
Returned call to pt regarding BP.  Pt checked BP this AM 114/75.  Pt informed to continue taking BP as prescribed.  Pt denies any headache, dizziness, chest pain, blurred vision, arm weakness/ numbness or slurred speech.  Pt stated he is feeling fine.  Pt to call if reading increase or experiencing any symptoms.  [name], RN

## 2013-10-31 ENCOUNTER — Other Ambulatory Visit: Payer: Self-pay | Admitting: Cardiovascular Disease

## 2013-11-06 ENCOUNTER — Encounter: Payer: Self-pay | Admitting: Family Medicine

## 2013-11-06 ENCOUNTER — Ambulatory Visit (INDEPENDENT_AMBULATORY_CARE_PROVIDER_SITE_OTHER): Payer: Medicare Other | Admitting: Family Medicine

## 2013-11-06 VITALS — BP 143/72 | HR 73 | Temp 97.8°F | Ht 65.0 in | Wt 199.0 lb

## 2013-11-06 DIAGNOSIS — I10 Essential (primary) hypertension: Secondary | ICD-10-CM

## 2013-11-06 DIAGNOSIS — I2510 Atherosclerotic heart disease of native coronary artery without angina pectoris: Secondary | ICD-10-CM

## 2013-11-06 DIAGNOSIS — Z8546 Personal history of malignant neoplasm of prostate: Secondary | ICD-10-CM

## 2013-11-06 NOTE — Assessment & Plan Note (Signed)
Adequate control.  Will continue home monitoring.  May need twice daily atenolol or change to other medication if runs high

## 2013-11-06 NOTE — Patient Instructions (Signed)
Good to see you today!  Thanks for coming in.  Use the cream on your neck twice daily until gone  If your blood pressure is > 150/90 either number regularly then come back in and bring all your readings and your medications   Use a mask if your clean up the dust  We decided not to follow your prostate blood test  Come back in 6 months or sooner if problems

## 2013-11-06 NOTE — Progress Notes (Signed)
   Subjective:    Patient ID: Jacob Norton, male    DOB: Mar 03, 1935, 79 y.o.   MRN: [id number]  HPI  Rash on neck Getting better but not gone.  Only using HC cream every few days.  Not spreading and no MM lesions.  No fever or fatigue  HYPERTENSION Disease Monitoring Home BP Monitoring has been checking at home - in AM 120/60-70  In PM Upper 140s/80s Chest pain- no    Dyspnea- no Medications Compliance-  Once in the evening atenolol. Lightheadedness-  No occasional chronic vertigo  Edema- no ROS - See HPI  PMH Lab Review   Potassium  Date Value Ref Range Status  07/10/2013 4.3  3.5 - 5.1 mEq/L Final     Sodium  Date Value Ref Range Status  07/10/2013 139  135 - 145 mEq/L Final     Creat  Date Value Ref Range Status  03/20/2013 1.14  0.50 - 1.35 mg/dL Final     Creatinine, Ser  Date Value Ref Range Status  07/10/2013 1.3  0.4 - 1.5 mg/dL Final        Review of Symptoms - see HPI  PMH - Smoking status noted.      Review of Systems     Objective:   Physical Exam  Alert no acute distress Rash - at base of neck anteriorraly a poorly marginated red rough rash.  No other lesions Heart - Regular rate and rhythm.  No murmurs, gallops or rubs.          Assessment & Plan:   Rash - appears to be contact and is improving. Recommend using twice daily otc cortisone and return if does not completely clear.

## 2013-11-06 NOTE — Assessment & Plan Note (Signed)
We discussed if we should be monitoring this.  He had radiation years ago elsewhere.  He feels there is not much that could be done if it recurred and would not want major surgery.  We agreed not to do any screening and monitor for symptoms

## 2013-12-06 ENCOUNTER — Ambulatory Visit (INDEPENDENT_AMBULATORY_CARE_PROVIDER_SITE_OTHER): Payer: Medicare Other | Admitting: Sports Medicine

## 2013-12-06 VITALS — BP 156/88 | HR 63 | Temp 98.0°F | Ht 67.5 in | Wt 198.4 lb

## 2013-12-06 DIAGNOSIS — I10 Essential (primary) hypertension: Secondary | ICD-10-CM

## 2013-12-06 NOTE — Progress Notes (Signed)
  Jacob Norton - 79 y.o. male MRN [id number]  Date of birth: 02/24/1935  SUBJECTIVE:     CC: Hypertension See problem based charting for additional subjective (including HPI, Interval History & ROS)   HISTORY:  Recent Labs  03/28/13 0824  TRIG 105  CHOL 133  HDL 39*  LDLCALC 73  } Wt Readings from Last 3 Encounters:  12/06/13 198 lb 6.4 oz (89.994 kg)  11/06/13 199 lb (90.266 kg)  10/11/13 198 lb (89.812 kg)   BP Readings from Last 3 Encounters:  12/06/13 156/88  11/06/13 143/72  10/11/13 120/79    History  Smoking status  . Former Smoker  . Types: Cigarettes  . Quit date: 09/13/1995  Smokeless tobacco  . Never Used    Comment: Smokes half a cigar once a month   No health maintenance topics applied.  Otherwise past Medical, Surgical, Social, and Family History Reviewed per EMR Medications and Allergies reviewed and updated per below.  VITALS: BP 156/88  Pulse 63  Temp(Src) 98 F (36.7 C) (Oral)  Ht 5' 7.5" (1.715 m)  Wt 198 lb 6.4 oz (89.994 kg)  BMI 30.60 kg/m2  PHYSICAL EXAM: GENERAL: Elderly but spry caucasian  male. In no discomfort; no respiratory distress  PSYCH: alert and appropriate, good insight   HNEENT: No JVD  CARDIO: RRR, S1/S2 heard, no murmur  LUNGS: CTA B, no wheezes, no crackles  ABDOMEN:   EXTREM:  Warm, well perfused.  Moves all 4 extremities spontaneously; no lateralization.  No noted foot lesions.  Distal pulses 1+/4.  No pretibial edema.  GU:   SKIN:     MEDICATIONS, LABS & OTHER ORDERS: Previous Medications   ASPIRIN 81 MG TABLET    Take 81 mg by mouth daily.     ATENOLOL (TENORMIN) 25 MG TABLET    TAKE 1 TABLET (25 MG TOTAL) BY MOUTH DAILY.   CHOLECALCIFEROL (VITAMIN D3) 2000 UNITS CAPSULE    Take 2,000 Units by mouth daily.   CO-ENZYME Q-10 30 MG CAPSULE    Take 30 mg by mouth 3 (three) times daily.   CYANOCOBALAMIN 100 MCG TABLET    Take 100 mcg by mouth daily.   DIFLORASONE (PSORCON) 0.05 % OINTMENT    APPLY TO AFFECTED AREA EVERY  DAY   FISH OIL OIL    daily.     LANSOPRAZOLE (PREVACID) 15 MG CAPSULE    Take 15 mg by mouth daily.   MECLIZINE (ANTIVERT) 25 MG TABLET    TAKE AS NEEDED   MULTIPLE VITAMINS-MINERALS (CENTRUM SILVER PO)    Take 1 tablet by mouth daily.     POLYETHYLENE GLYCOL (MIRALAX / GLYCOLAX) PACKET    Take 17 g by mouth daily.   SIMVASTATIN (ZOCOR) 80 MG TABLET    TAKE 40 MG TABLET BY MOUTH AT BEDTIME   TERAZOSIN (HYTRIN) 5 MG CAPSULE    TAKE 1 CAPSULE EVERY DAY   Modified Medications   No medications on file   New Prescriptions   No medications on file   Discontinued Medications   No medications on file  No orders of the defined types were placed in this encounter.   ASSESSMENT & PLAN: See problem based charting & AVS for pt instructions.

## 2013-12-06 NOTE — Patient Instructions (Signed)
Start taking your atenolol first thing in the morning. Keep checking her blood pressure throughout the day.  If this number is consistently above 150/90 please let us know and schedule a followup appointment.  Here are some basic nutrition rules to remember:  "[place]" - if you think it was made in a factory . . it is best to avoid as a staple in your diet.  Limiting processed  foods to 1-2 times per week is a good idea.  Food that came from the ground, from a tree, from a plant or is a plant, is food that you can essentially eat as much of it he would like as long as it looks like it did when it came from that place!!!  I've never seen a french fry come out of the ground!  Obviously everything should be eaten in moderation but this can be generally applied.   Limit your salt intake (in general aim for less than 3000mg  per day). Sticking with fresh fruits and vegetables as well as home cooked meals will typically provide more nutrition and less salt than prepackaged meals.     Limit the amount of sugar sweetened and artificially sweetened foods and beverages.  Avoid soda, juices and generally any bottled beverage is a good idea.  Sticking with water flavored with a slice of lemon, lime or orange is a great option if you want something with flavor in it.  Using flavored seltzer water to flavor plain water will also add some bite if you want something more than flavor.      Eat at least 3 meals and 1-2 snacks per day.  Aim for no more than 5 hours between eating.  This will actually increase your metabolism and help prevent you from overeating.   Here are 2 of my favorite web sites that provide great nutrition and exercise advice.   www.eatsmartmovemore[place].com https://stone-lopez.com/  Here are some basic exercise recommendations to remember:  Try to be active every day and throughout the day.      Ideally, I recommend you exercise for at least 30 minutes per day, 5  days per week.  This would involve any activity that will elevate your heart rate to the point that you have a hard time carrying on a normal conversation, but not to the point of being completely out of breath.  There are alternative options however this is a generally good goal to strive for.   We have actually found that being active throughout the day is more important than getting to the gym 5 days per week.  Minimizing being in active should be an important health goal we all are working towards.  A basic starting point can be limiting the time that you sit or lay still during the day.  You should sit/lay/lounge for no longer than 20-30 minutes at a time if you are able.  Even interrupting sitting with standing/jumping jacks/dancing/etc for one minute can have significant health benefits.    Try to remember: "Why sit when you can stand, why stand when you can walk, why walk when you can run."  The point he is to look for opportunities during the day where you can increase your heart rate.

## 2013-12-09 ENCOUNTER — Telehealth: Payer: Self-pay | Admitting: *Deleted

## 2013-12-09 NOTE — Assessment & Plan Note (Signed)
Problem Based Documentation:    Subjective Report:  Patient reports being concerned as he has had a couple of blood pressures greater than 800 systolic in the past month.  These elevated numbers typically occur later during the day and are not associated with any symptoms.     currently denies any orthostasis, lightheadedness or dizziness.  He has had multiple blood pressure readings first thing in the morning that are in the low 349Z systolic range as well as the 79X diastolic.   He denies any chest pain, tachycardia palpitations, orthopnea, syncope or presyncope.  No lower extremity edema  He has been following dietary restrictions and has been trying to remain active but has been difficult due to the weather.     Assessment & Plan & Follow up Issues:   chronic overall well controlled condition 1. discussed multiple options but ultimately decided to change atenolol 2 every morning dosing as he seems to be markedly well controlled first thing in the morning on his each bedtime dosing but may be wearing off by the end of the day when he is most active .  Discussed the possibility of adding a second dose however given the low diastolic blood pressures I'm concerned for potentially causing orthostasis.  Trial every morning dosing and followup in 2-3 weeks if persistently greater than 150/90.   >  consider either additional 25 mg dose (qhs) or titrating to 50 mg every morning.

## 2013-12-09 NOTE — Telephone Encounter (Signed)
Patient brought a copy of his Advanced Directives (Living Will & HCPOA) to office to be scanned into his chart.  Copy placed in scan box and will be sent to [facility] to be scanned into Epic.  Burna Forts, BSN, RN-BC

## 2013-12-10 ENCOUNTER — Encounter: Payer: Self-pay | Admitting: *Deleted

## 2013-12-12 ENCOUNTER — Encounter: Payer: Self-pay | Admitting: Sports Medicine

## 2013-12-21 ENCOUNTER — Encounter: Payer: Self-pay | Admitting: Sports Medicine

## 2013-12-30 ENCOUNTER — Telehealth: Payer: Self-pay | Admitting: Family Medicine

## 2013-12-31 ENCOUNTER — Telehealth: Payer: Self-pay | Admitting: Clinical

## 2013-12-31 NOTE — Telephone Encounter (Signed)
CSW received a call from pt requesting to meet with CSW to discuss ALF placement. Appt scheduled for 01/02/14 at 8:30am.  Hunt Oris, MSW, [place]

## 2014-01-02 ENCOUNTER — Encounter: Payer: Self-pay | Admitting: Clinical

## 2014-01-02 NOTE — Progress Notes (Signed)
CSW met with pt to discuss Independent Living placement. Pt states he has visited [facility] and was pleased however it would cost $2300 a month. CSW explored pt current living situation and the amount of support patient has at home. Pt states he lives alone however spends a good amount of time out with friends and sees his son daily for lunch. Pt appears fairly independent, continues to drive, remembers to take his medication and expresses no concerns. CSW provided pt with several resources: a list of private agencies that offer in home services, a list of independent/assisted/and skilled facilities. CSW also contacted a few facilities while pt in the room to inquire about costs. Pt very receptive and appreciative.  CSW noticed pt making several comment regarding end of life. Pt at one time said he would not want to be a burden if he was ever unable to care for himself. Pt also stated he thought there should be a pill to "end it" if someone wanted to. CSW did assess pt SI, mental and emotional state. Pt was alert and oriented during the entire visit and pt denied having thoughts of harming himself. CSW explored whether pt discusses end of life with his family and pt stated he does. Pt also stated that his son [name] is his HCPOA and that pt has discussed code status with his family. Pt did not report any depression or feelings of hurting himself.  CSW provided CSW contact information and encouraged pt to call CSW if any needs arise.  Hunt Oris, MSW, [place]

## 2014-01-09 ENCOUNTER — Encounter: Payer: Self-pay | Admitting: Family Medicine

## 2014-01-09 ENCOUNTER — Ambulatory Visit (INDEPENDENT_AMBULATORY_CARE_PROVIDER_SITE_OTHER): Payer: Medicare Other | Admitting: Family Medicine

## 2014-01-09 VITALS — BP 99/60 | HR 75 | Temp 97.6°F | Wt 194.0 lb

## 2014-01-09 DIAGNOSIS — R413 Other amnesia: Secondary | ICD-10-CM

## 2014-01-09 DIAGNOSIS — I10 Essential (primary) hypertension: Secondary | ICD-10-CM

## 2014-01-09 DIAGNOSIS — R21 Rash and other nonspecific skin eruption: Secondary | ICD-10-CM

## 2014-01-09 DIAGNOSIS — R519 Headache, unspecified: Secondary | ICD-10-CM | POA: Insufficient documentation

## 2014-01-09 DIAGNOSIS — R51 Headache: Secondary | ICD-10-CM

## 2014-01-09 DIAGNOSIS — I2510 Atherosclerotic heart disease of native coronary artery without angina pectoris: Secondary | ICD-10-CM

## 2014-01-09 NOTE — Progress Notes (Signed)
FAMILY MEDICINE OFFICE NOTE  Chief Complaint:  rash  Primary Care Physician: Lind Covert, MD  HPI:  Jacob Norton is here for a rash on neck and HTN.   1) HTN - has been on atenolol for awhile on a stable dose - has been checking bp 4x daily - has had 4 readings that were 150s-160s/90s over the past 2 months - had a sheet that told him to come in if his pressures were consistently high so he came in based on those 4 values - no cp, sob, LEE, headaches, vision changes.  - feels fine most of the time  - he does mention occasional HA in general but not associated with BP elevation  2) Rash - has been there for years - comes and goes  - has been on steroid cream and initially was on diflorasone and then got transitioned to hydrocortisone - the hydrocortisone doesn't work - rash goes away on its own without intervention -doesn't itch or bother him - always comes up on neck and si red and blotchy and then goes away.    No fevers, chills, sob, other areas of irritation, joint pain, difficulty speaking.    3) what medicine can he take with a  Cold - knows some aren't safe and isn't sure which to take  4) what can he take for a headache - not sure.     PMHx:  Past Medical History  Diagnosis Date  . Hypertension   . Hyperlipidemia   . Blood transfusion   . Prostate cancer     Past Surgical History  Procedure Laterality Date  . Prostate surgery    . Colon surgery  1991  . Total knee arthroplasty      left  . Cholecystectomy  03/25/11  . Appendectomy  1965  . Coronary artery bypass graft  1994  . Hernia repair  1965  . Coronary angioplasty with stent placement  1994    FAMHx:  Family History  Problem Relation Age of Onset  . Colon cancer Neg Hx   . Esophageal cancer Neg Hx   . Rectal cancer Neg Hx   . Stomach cancer Neg Hx     SOCHx:   reports that he quit smoking about 18 years ago. His smoking use included Cigarettes. He smoked 0.00 packs per day.  He has never used smokeless tobacco. He reports that he drinks about .5 ounces of alcohol per week. He reports that he does not use illicit drugs.  ALLERGIES:  No Known Allergies  ROS: Pertinent ROS as seen in HPI. Otherwise negative.   HOME MEDS: Current Outpatient Prescriptions  Medication Sig Dispense Refill  . aspirin 81 MG tablet Take 81 mg by mouth daily.        Marland Kitchen atenolol (TENORMIN) 25 MG tablet TAKE 1 TABLET (25 MG TOTAL) BY MOUTH DAILY.  30 tablet  10  . Cholecalciferol (VITAMIN D3) 2000 UNITS capsule Take 2,000 Units by mouth daily.      Marland Kitchen co-enzyme Q-10 30 MG capsule Take 30 mg by mouth 3 (three) times daily.      . cyanocobalamin 100 MCG tablet Take 100 mcg by mouth daily.      . diflorasone (PSORCON) 0.05 % ointment APPLY TO AFFECTED AREA EVERY DAY  30 g  2  . Fish Oil OIL daily.        . lansoprazole (PREVACID) 15 MG capsule Take 15 mg by mouth daily.      . meclizine (  ANTIVERT) 25 MG tablet TAKE AS NEEDED  30 tablet  2  . Multiple Vitamins-Minerals (CENTRUM SILVER PO) Take 1 tablet by mouth daily.        . polyethylene glycol (MIRALAX / GLYCOLAX) packet Take 17 g by mouth daily.      . simvastatin (ZOCOR) 80 MG tablet TAKE 40 MG TABLET BY MOUTH AT BEDTIME      . terazosin (HYTRIN) 5 MG capsule TAKE 1 CAPSULE EVERY DAY  90 capsule  3   No current facility-administered medications for this visit.    LABS/IMAGING: No results found for this or any previous visit (from the past 48 hour(s)). No results found.  VITALS: BP 99/60  Pulse 75  Temp(Src) 97.6 F (36.4 C) (Oral)  Wt 194 lb (87.998 kg)  EXAM: Gen: NAD, well appearing HEENT: oropharynx clear, pupils equal and reactive PULM: LCTAB, no wheezes/rhonchi/rales CV: RRR, no murmurs ABD: soft, NT, ND EXT: 2+ DP pulses, no edema    ASSESSMENT: HYPERTENSION  CORONARY ARTERY DISEASE  Memory loss  Rash and nonspecific skin eruption  PLAN: See separate A&P  Kassie Mends, MD

## 2014-01-09 NOTE — Assessment & Plan Note (Signed)
-   overall controlled. 4 elevated readings out of approx 50 - almost too well controlled today but no symptoms of hypotension - no change to current atenolol dosing - reassurance given - discussed sitting prior to taking bp and decreasing bp checks to BID

## 2014-01-09 NOTE — Assessment & Plan Note (Signed)
Written instructions provided of above instructions

## 2014-01-09 NOTE — Assessment & Plan Note (Signed)
-   likely hive reaction based on time course and story.  - not bothering him.  - reassurance provided - discussed risks and benefits of long term steroid use vs hives and pt opted at this time to just wait and see what happens.  - will avoid rx currently but should the rash become symptomatic or spread, we will consider treatment.

## 2014-01-09 NOTE — Assessment & Plan Note (Signed)
Unrelated to HTN Recommend tylenol for rare occasion No red flag symptoms (nighttime awakening, vomiting, recurrence)

## 2014-01-09 NOTE — Patient Instructions (Signed)
1) for your blood pressure - let's not change a thing with your medicine -you can decrease to taking your blood pressure twice a day  2) if you get headaches, you can take tylenol  3) for colds, you want to try and avoid anything with a decongestant - coricidan is a great medicine for you  4) for the rash - steroids long term will thin your skin  Since the rash isn't bothering you, I would recommend just watching it and if it gets to a point that it bothers you, then we can talk about options.  - i think it is probably more of a hive rash

## 2014-01-15 ENCOUNTER — Ambulatory Visit (INDEPENDENT_AMBULATORY_CARE_PROVIDER_SITE_OTHER): Payer: Medicare Other | Admitting: Family Medicine

## 2014-01-15 ENCOUNTER — Encounter: Payer: Self-pay | Admitting: Family Medicine

## 2014-01-15 VITALS — BP 112/60 | HR 70 | Temp 98.1°F | Resp 18 | Wt 195.0 lb

## 2014-01-15 DIAGNOSIS — R21 Rash and other nonspecific skin eruption: Secondary | ICD-10-CM

## 2014-01-15 DIAGNOSIS — I2510 Atherosclerotic heart disease of native coronary artery without angina pectoris: Secondary | ICD-10-CM

## 2014-01-15 NOTE — Assessment & Plan Note (Signed)
1) rash on neck - does still appear to be a likely allergic reaction - not bothering him - discussed as last time not to use steroid cream often due to side effect  2) papule on hand - likely insect bite  - cortisone cream ok - advised to return if erythema worsens or spreads.

## 2014-01-15 NOTE — Patient Instructions (Signed)
1) for the rash on the hand - it is likely a bite so you are ok to put cortisone on it.   2) the neck still looks more allergic and if it doesn't bother you, then leave it alone

## 2014-01-15 NOTE — Progress Notes (Signed)
Subjective:     Patient ID: RASHEED WELTY, male   DOB: Dec 30, 1934, 79 y.o.   MRN: [id number]  HPI  79 yo with MMP as per problem list who presents for rash.    - has a hx of rashes from time to time on the neck for which he has been on steroid cream for. - rash came back and so wanted to come in and be seen - also has a small rash bump on his hand.   - says neither rash is bothering him - small bump on his hand does seem to itch but just started yesterday.  - not sure how he got it.        No fevers, chills, nausea, vomiting, sob, diarrhea.   Review of Systems See above.     Objective:   Physical Exam Filed Vitals:   01/15/14 1343  BP: 112/60  Pulse: 70  Temp: 98.1 F (36.7 C)  TempSrc: Oral  Resp: 18  Weight: 195 lb (88.451 kg)   GEN: NAD, well appearing Neck: slightly erythematous but without any obvious irritation or distribution. No macules or papules. Right hand- small papule with surrounding erythema.     Assessment:     Rash and nonspecific skin eruption      Plan:     1) rash on neck - does still appear to be a likely allergic reaction - not bothering him - discussed as last time not to use steroid cream often due to side effect  2) papule on hand - likely insect bite  - cortisone cream ok - advised to return if erythema worsens or spreads.    3) f/u as scheduled with PCP next month.    Kassie Mends, MD

## 2014-01-21 ENCOUNTER — Encounter: Payer: Self-pay | Admitting: Family Medicine

## 2014-01-21 ENCOUNTER — Ambulatory Visit (INDEPENDENT_AMBULATORY_CARE_PROVIDER_SITE_OTHER): Payer: Medicare Other | Admitting: Family Medicine

## 2014-01-21 VITALS — BP 136/73 | HR 62 | Temp 98.1°F | Ht 65.0 in | Wt 189.4 lb

## 2014-01-21 DIAGNOSIS — R358 Other polyuria: Secondary | ICD-10-CM

## 2014-01-21 DIAGNOSIS — T148XXA Other injury of unspecified body region, initial encounter: Secondary | ICD-10-CM

## 2014-01-21 DIAGNOSIS — R3589 Other polyuria: Secondary | ICD-10-CM

## 2014-01-21 DIAGNOSIS — Z87898 Personal history of other specified conditions: Secondary | ICD-10-CM

## 2014-01-21 LAB — POCT URINALYSIS DIPSTICK
BILIRUBIN UA: NEGATIVE
Blood, UA: NEGATIVE
Glucose, UA: NEGATIVE
Ketones, UA: NEGATIVE
LEUKOCYTES UA: NEGATIVE
Nitrite, UA: NEGATIVE
PH UA: 6
PROTEIN UA: NEGATIVE
Spec Grav, UA: <=1.005
Urobilinogen, UA: 0.2

## 2014-01-21 NOTE — Assessment & Plan Note (Signed)
Increased urinary frequency likely related to increased intake. U/A completely normal. Discussed lifestyle modifications and benefit/risk of alpha blockers. Developed plan with patient to hold off on that, as he has had low AM BP's and this may exacerbate that. Discussed need for further evaluation if he develops incontinence, distention, or other symptoms of UTI.

## 2014-01-21 NOTE — Progress Notes (Signed)
Patient ID: JLON BETKER, male   DOB: 05-16-1935, 79 y.o.   MRN: [id number]   Subjective:  HPI:   FARAAZ WOLIN is a 79 y.o. male with a history of BPH and colon CA here for lower abdominal pain that has resolved.   He reports lower abdominal pain radiating around to the lower back that was mild-moderate, intermittent, and not provoked by any trauma or action. He thinks it may have been due to stretching exercises he does everyday. He thought it might be due to bladder issues, but denies dysuria or incontinence or abnormalities in urine. He reports frequent urination but has also been drinking upwards of 64 oz total of water and cranberry juice per day. He took cranberry juice because a friend told him that would help and his abdominal pain completely resolved after that (2 days ago), but thought he'd come discuss it with a doctor. No constipation, fever, saddle anesthesia or incontinence.   He experiences nocturia 2-3 times per night.  He has had intermittent trouble with stream starting and stopping, but denies incomplete emptying, need to go again about 2 hours after urinating, needing to strain, difficulty holding his urine,   Review of Systems:  Per HPI. All other systems reviewed and are negative.    Past Medical History: Patient Active Problem List   Diagnosis Date Noted  . Rash and nonspecific skin eruption 01/09/2014  . Headache(784.0) 01/09/2014  . Dysphagia, unspecified(787.20) 07/10/2013  . Hernia, ventral 07/10/2013  . Personal history of colonic polyps 07/10/2013  . Memory loss 08/06/2012  . GERD (gastroesophageal reflux disease) 05/21/2012  . Vertigo 07/11/2011  . Mixed hyperlipidemia 12/14/2010  . COLON CANCER, HX OF 02/24/2010  . BENIGN PROSTATIC HYPERTROPHY, HX OF 02/23/2010  . HYPERTENSION 11/19/2008  . CORONARY ARTERY DISEASE 11/19/2008  . OSTEOARTHRITIS 11/19/2008  . Personal history of malignant neoplasm of prostate 11/19/2008  . CORONARY ARTERY BYPASS GRAFT, HX OF  11/19/2008    Medications: reviewed and updated Current Outpatient Prescriptions  Medication Sig Dispense Refill  . aspirin 81 MG tablet Take 81 mg by mouth daily.        Marland Kitchen atenolol (TENORMIN) 25 MG tablet TAKE 1 TABLET (25 MG TOTAL) BY MOUTH DAILY.  30 tablet  10  . Cholecalciferol (VITAMIN D3) 2000 UNITS capsule Take 2,000 Units by mouth daily.      Marland Kitchen co-enzyme Q-10 30 MG capsule Take 30 mg by mouth 3 (three) times daily.      . cyanocobalamin 100 MCG tablet Take 100 mcg by mouth daily.      . diflorasone (PSORCON) 0.05 % ointment APPLY TO AFFECTED AREA EVERY DAY  30 g  2  . Fish Oil OIL daily.        . lansoprazole (PREVACID) 15 MG capsule Take 15 mg by mouth daily.      . meclizine (ANTIVERT) 25 MG tablet TAKE AS NEEDED  30 tablet  2  . Multiple Vitamins-Minerals (CENTRUM SILVER PO) Take 1 tablet by mouth daily.        . polyethylene glycol (MIRALAX / GLYCOLAX) packet Take 17 g by mouth daily.      . simvastatin (ZOCOR) 80 MG tablet TAKE 40 MG TABLET BY MOUTH AT BEDTIME      . terazosin (HYTRIN) 5 MG capsule TAKE 1 CAPSULE EVERY DAY  90 capsule  3   No current facility-administered medications for this visit.   Objective:  Physical Exam: There were no vitals taken for this visit.  Gen: Well-appearing 79 y.o. male in NAD CV: RRR, no MRG, no JVD Resp: Non-labored, CTAB, no wheezes noted Abd: Soft, NTND, BS present, no guarding or organomegaly. No suprapubic or CVA tenderness MSK: No edema noted, full ROM. No tenderness to palpation along lower spine or paraspinal musculature.  Neuro: Alert and oriented, speech normal.   Assessment:     DALLYN BERGLAND is a 79 y.o. male here for mild-moderate lower abdominal pain, now resolved. Presumptive Dx muscle strain.     Plan:     See problem list for problem-specific plans.

## 2014-01-21 NOTE — Assessment & Plan Note (Signed)
Likely due to over-exertion and now resolved. Advised gentle stretches. No signs of hernia.

## 2014-01-21 NOTE — Patient Instructions (Addendum)
Thank you for coming in today!  Stretch gently before any exercise. If you feel a muscle pull, stop exercising immediately.   You should also avoid fluids before going to bed or going out Reduce consumption of caffeine and alcohol Sit down to urinate if you are unable to void completely  Take care and seek immediate care sooner if you develop any concerns.  Please feel free to call with any questions or concerns at any time, at [phone]. - Dr. Bonner Puna

## 2014-01-29 ENCOUNTER — Encounter: Payer: Self-pay | Admitting: Family Medicine

## 2014-02-03 ENCOUNTER — Telehealth: Payer: Self-pay | Admitting: Family Medicine

## 2014-02-03 NOTE — Telephone Encounter (Signed)
Patient calling the emergency line with a medication concern. He took atenolol 25mg  daily this morning and took it this evening around 7pm thinking it was morning time. He calls to see if he needs to do anything about this.  Patient took his BP while on the phone which was 190/101 and HR: 76. He denies any chest pain, double vision, headache, shortness of breath. Unclear why his bp is high other than the fact that he states that he has been agitated this evening after realizing he had taken his medication twice.  Since his BP and HR are not low, recommended that he plan on taking the morning dose of atenolol tomorrow am, UNLESS BP<100/50 and HR<55, at which point he is instructed to call this number again.  I also asked him to call us back or get medical attention if he developed shortness of breath, CP, headache or double vision.  Patient expressed understanding and agreed with plan.   Jacob Norton, PGY-3 Family Medicine Resident

## 2014-02-04 ENCOUNTER — Telehealth: Payer: Self-pay | Admitting: Family Medicine

## 2014-02-04 NOTE — Telephone Encounter (Signed)
Patient called the emergency line again to let me know of his current vitals: BP: 135/77 and HR:69. No dizziness, no light headedness, no chest pain, double vision or SOB.  Recommended for him to go ahead and take the atenolol this am as prescribed.  Patient expressed understanding and agreed with plan.   Liam Graham, PGY-3 Family Medicine Resident

## 2014-02-05 ENCOUNTER — Ambulatory Visit (INDEPENDENT_AMBULATORY_CARE_PROVIDER_SITE_OTHER): Payer: Medicare Other | Admitting: Family Medicine

## 2014-02-05 ENCOUNTER — Encounter: Payer: Self-pay | Admitting: Family Medicine

## 2014-02-05 VITALS — BP 118/71 | HR 66 | Temp 98.1°F | Wt 194.0 lb

## 2014-02-05 DIAGNOSIS — W57XXXA Bitten or stung by nonvenomous insect and other nonvenomous arthropods, initial encounter: Secondary | ICD-10-CM

## 2014-02-05 DIAGNOSIS — T148 Other injury of unspecified body region: Secondary | ICD-10-CM

## 2014-02-05 DIAGNOSIS — I2510 Atherosclerotic heart disease of native coronary artery without angina pectoris: Secondary | ICD-10-CM

## 2014-02-05 NOTE — Patient Instructions (Signed)
Insect Bite °Mosquitoes, flies, fleas, bedbugs, and other insects can bite. Insect bites are different from insect stings. The bite may be red, puffy (swollen), and itchy for 2 to 4 days. Most bites get better on their own. °HOME CARE  °· Do not scratch the bite. °· Keep the bite clean and dry. Wash the bite with soap and water. °· Put ice on the bite. °· Put ice in a plastic bag. °· Place a towel between your skin and the bag. °· Leave the ice on for 20 minutes, 4 times a day. Do this for the first 2 to 3 days, or as told by your doctor. °· You may use medicated lotions or creams to lessen itching as told by your doctor. °· Only take medicines as told by your doctor. °· If you are given medicines (antibiotics), take them as told. Finish them even if you start to feel better. °You may need a tetanus shot if: °· You cannot remember when you had your last tetanus shot. °· You have never had a tetanus shot. °· The injury broke your skin. °If you need a tetanus shot and you choose not to have one, you may get tetanus. Sickness from tetanus can be serious. °GET HELP RIGHT AWAY IF:  °· You have more pain, redness, or puffiness. °· You see a red line on the skin coming from the bite. °· You have a fever. °· You have joint pain. °· You have a headache or neck pain. °· You feel weak. °· You have a rash. °· You have chest pain, or you are short of breath. °· You have belly (abdominal) pain. °· You feel sick to your stomach (nauseous) or throw up (vomit). °· You feel very tired or sleepy. °MAKE SURE YOU:  °· Understand these instructions. °· Will watch your condition. °· Will get help right away if you are not doing well or get worse. °Document Released: 08/26/2000 Document Revised: 11/21/2011 Document Reviewed: 03/30/2011 °Exit[employer]® Patient Information ©2014 [employer]. ° °

## 2014-02-05 NOTE — Progress Notes (Signed)
Subjective:     Patient ID: Jacob Norton, male   DOB: 1935/06/06, 79 y.o.   MRN: [id number]  HPI 79 y.o. M here for concern of bug bite near right ear. 3d ago on lipomatous growth behind ear. Pt has been applying cortisone 10 and itching and redness has mostly resolved.  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   Review of Systems No f/c, no headache, no weight loss, no night sweats    Objective:   Physical Exam Filed Vitals:   02/05/14 1335  BP: 118/71  Pulse: 66  Temp: 98.1 F (36.7 C)   VSS NAD Mobile soft mass behind right ear. Small red papule likely reactive to bug bite.     Assessment:     79 y.o. M s/p Bugbite resolving at this time. Continue steroid cream. No evidence of infection. Pt states understanding  Stable lipoma. Recommend pt monitor for changes. No imaging at this time, neg B symptoms. Follow up as needed. Consider additional immaging if changing.  Fredrik Rigger, MD OB Fellow

## 2014-02-07 ENCOUNTER — Telehealth: Payer: Self-pay | Admitting: Family Medicine

## 2014-02-07 NOTE — Telephone Encounter (Signed)
Mr. Rambert need you to call him regarding his concern about his atenolol medication and consuming a lot of cranberry juice.  Did not give specifics as to what symptoms he's having, but wanted to speak with provider.  Please call him at [facility].

## 2014-02-10 NOTE — Telephone Encounter (Signed)
Discussed if drinks one glass a day should not bother him Other wise he feels well

## 2014-04-08 ENCOUNTER — Telehealth: Payer: Self-pay | Admitting: Family Medicine

## 2014-04-08 ENCOUNTER — Ambulatory Visit (INDEPENDENT_AMBULATORY_CARE_PROVIDER_SITE_OTHER): Payer: Medicare Other | Admitting: *Deleted

## 2014-04-08 VITALS — BP 124/70

## 2014-04-08 DIAGNOSIS — Z0130 Encounter for examination of blood pressure without abnormal findings: Secondary | ICD-10-CM

## 2014-04-08 DIAGNOSIS — Z136 Encounter for screening for cardiovascular disorders: Secondary | ICD-10-CM

## 2014-04-08 NOTE — Telephone Encounter (Signed)
Pt called and wanted to know if the doctor would consider a medication that is for memory? Please call him to go over the options jw

## 2014-04-08 NOTE — Progress Notes (Signed)
   Pt in nurse clinic for blood pressure check.  Blood pressure 122/70 manually.  Pt denies any chest pain, SOB, dizziness or numbness/tingling of extremities.  Pt stated he is a little stress due to moving out of his apartment.  Pt has been recording blood pressure reading over the last few weeks.  A few readings from the lisit 04/08/2014 106/68, 87; 04/07/2014 145/75, 75; 04/06/2014 138/75, 74.  A copy of reading scanned for pt's record. Pt advised to call clinic if he started experiencing any of the symptoms listed above or go to emergency room.  [name], RN

## 2014-04-08 NOTE — Telephone Encounter (Signed)
He wouldneed to comefor office visit  Can see me 2 mo osomeone else like geri clinic  thNks

## 2014-04-09 NOTE — Telephone Encounter (Signed)
Pt schedule in geri clinic 04/15/14 @ 2pm. Jacob Norton, [name]

## 2014-04-15 ENCOUNTER — Ambulatory Visit: Payer: Medicare Other

## 2014-04-22 ENCOUNTER — Ambulatory Visit (INDEPENDENT_AMBULATORY_CARE_PROVIDER_SITE_OTHER): Payer: Medicare Other | Admitting: Family Medicine

## 2014-04-22 ENCOUNTER — Encounter: Payer: Self-pay | Admitting: Family Medicine

## 2014-04-22 VITALS — BP 138/73 | HR 72 | Temp 98.1°F | Wt 195.0 lb

## 2014-04-22 DIAGNOSIS — R413 Other amnesia: Secondary | ICD-10-CM

## 2014-04-22 DIAGNOSIS — B91 Sequelae of poliomyelitis: Secondary | ICD-10-CM

## 2014-04-22 DIAGNOSIS — Z23 Encounter for immunization: Secondary | ICD-10-CM

## 2014-04-22 DIAGNOSIS — Z8546 Personal history of malignant neoplasm of prostate: Secondary | ICD-10-CM

## 2014-04-22 DIAGNOSIS — G14 Postpolio syndrome: Secondary | ICD-10-CM

## 2014-04-22 DIAGNOSIS — R2689 Other abnormalities of gait and mobility: Secondary | ICD-10-CM

## 2014-04-22 DIAGNOSIS — R3915 Urgency of urination: Secondary | ICD-10-CM

## 2014-04-22 DIAGNOSIS — E559 Vitamin D deficiency, unspecified: Secondary | ICD-10-CM

## 2014-04-22 DIAGNOSIS — I10 Essential (primary) hypertension: Secondary | ICD-10-CM

## 2014-04-22 DIAGNOSIS — Z87898 Personal history of other specified conditions: Secondary | ICD-10-CM

## 2014-04-22 DIAGNOSIS — R42 Dizziness and giddiness: Secondary | ICD-10-CM

## 2014-04-22 DIAGNOSIS — N401 Enlarged prostate with lower urinary tract symptoms: Secondary | ICD-10-CM

## 2014-04-22 DIAGNOSIS — I2510 Atherosclerotic heart disease of native coronary artery without angina pectoris: Secondary | ICD-10-CM

## 2014-04-22 DIAGNOSIS — N138 Other obstructive and reflux uropathy: Secondary | ICD-10-CM

## 2014-04-22 DIAGNOSIS — H9193 Unspecified hearing loss, bilateral: Secondary | ICD-10-CM | POA: Insufficient documentation

## 2014-04-22 DIAGNOSIS — R269 Unspecified abnormalities of gait and mobility: Secondary | ICD-10-CM

## 2014-04-22 LAB — BASIC METABOLIC PANEL
Creatinine: 1.2 mg/dL (ref <=1.3)
POTASSIUM: 4.2 mmol/L (ref 3.4–5.3)

## 2014-04-22 MED ORDER — TAMSULOSIN HCL 0.4 MG PO CAPS: 0.4000 mg | ORAL_CAPSULE | Freq: Every day | ORAL | Status: DC

## 2014-04-22 NOTE — Patient Instructions (Addendum)
Good to see you today.  For your imbalance and weakness,  - Please do exercises we discussed today. - We are referring you to PT for rehabilitation exercises and to show you exercises to help deal with your Meniere's disease without medication. - Please stop taking meclizine and terazosin.  We are prescribing flomax to try for your difficulty urinating. Call us if this is not working well or you are having abnormal side effects to it. We are referring you to a urologist.  We are also checking labs today and will call you if anything is not normal.  It is okay to switch to metamucil powder.  You scored 25 on your memory testing. Namenda and Aricept are not appropriate for you to start at this time because of their minimal benefit and side effects.  You got a prevnar immunization today.   Tax adviser  Address :[address] : [place] Zip : [place] Website : [email or web address] Contact Email : [email or web address] Office Phone : [phone] Information Phone : [phone] Special Notes : [facility]- [phone] [place]- [phone]  [employer] [facility] [place] can provide information on local resources including:   Information and Referral (Senior InfoLine) providing seniors, family members, caregivers and others access to information and assistance for the elderly.  Home Delivered Meals (Meals on Wheels)  [place] for those ages 88 and over are offered daytime supervised programs targeted to meet their social, educational, physical and recreational interests. A lunch is also served to those who meet the eligibility criteria.  Caregivers - Volunteer-based program designed to provide seniors with 1-3 hours of service per week. Services include friendly visiting, assistance with grocery shopping, errands, general transportation, etc.  Medical  Transportation: Transportation to medical appointments for seniors ages 51 and over who do not receive Medicaid. Funds are occasionally available to transport disabled individuals under 60 who do not receive Medicaid.  [employer] (S.H[place]I.I.P.): Volunteers are trained by the [place] S[place] office, to provide free information, counseling and education to [employer] beneficiaries and their family members about: Medicare, Medicare Supplements, [place], and [place] Prescription Drug Plans.  Nutritional Supplement Program: Ensure, Ensure Plus, and Glucerna products are sold at reduced prices. Simply fill out an application.    Mild Neurocognitive Disorder Mild neurocognitive disorder (formerly known as mild cognitive impairment) is a mental disorder. It is a slight abnormal decrease in mental function. The areas of mental function affected may include memory, thought, communication, behavior, and completion of tasks. The decrease is noticeable and measurable but for the most part does not interfere with your daily activities. Mild neurocognitive disorder typically occurs in people older than 60 years but can occur earlier. It is not as serious as major neurocognitive disorder (formerly known as dementia) but may lead to a more serious neurocognitive disorder. However, in some cases the condition does not get worse. A few people with this disorder even improve. CAUSES  There are a number of different causes of mild neurocognitive disorder:  Brain disorders associated with abnormal protein deposits, such as Alzheimer's disease, Pick's disease, and Lewy body disease. Brain disorders associated with abnormal movement, such as Parkinson's disease and Huntington's disease. Diseases affecting blood vessels in the brain and resulting in mini-strokes. Certain infections, such as human immunodeficiency virus (HIV) infection. Traumatic brain  injury. Other medical conditions such as brain tumors, underactive thyroid (hypothyroidism), and vitamin B12  deficiency. Use of certain prescription medicine and "recreational" drugs. SYMPTOMS  Symptoms of mild neurocognitive disorder include: Difficulty remembering. You may forget details of recent events, names, or phone numbers. You may forget important social events and appointments or repeatedly forget where you put your car keys. Difficulty thinking and solving problems. You may have trouble with complex tasks such as paying bills or driving in unfamiliar locations. Difficulty communicating. You may have trouble finding the right word, naming an object, forming a sentence that makes sense, or understanding what you read or hear. Changes in your behavior or personality. You may lose interest in the things that you used to enjoy or withdraw from social situations. You may get angry more easily than usual. You may act before thinking. You may do things in public that you would not usually do. You may hear or see things that are not real (hallucinations). You may believe falsely that others are trying to hurt you (paranoia). DIAGNOSIS Mild neurocognitive disorder is diagnosed through an assessment by your health care provider. Your health care provider will ask you and your family, friends, or coworkers questions about your symptoms. He or she will ask how often the symptoms occur, how long they have been occurring, whether they are getting worse, and the effect they are having on your life. Your health care provider may refer you to a neurologist or mental health specialist for a detailed evaluation of your mental functions (neuropsychological testing).  To identify the cause of your mild neurocognitive disorder, your health care provider may: Obtain a detailed medical history. Ask about alcohol and drug use, including prescription medicine. Perform a physical exam. Order blood tests and brain  imaging exams. TREATMENT  Mild neurocognitive disorder caused by infections, use of certain medicines or "recreational" drugs, and certain medical conditions may improve with treatment of the condition that is causing the disorder. Mild neurocognitive disorder resulting from other causes generally does not improve and may worsen. In these cases, the goal of treatment is to slow progression of the disorder and help you cope with the loss of mental function. Treatments in these cases include:  Medicine. Medicine helps mainly with memory loss and behavioral symptoms.  Talk therapy. Talk therapy provides education, emotional support, memory aids, and other ways of making up for decreases in mental function.  Lifestyle changes. These include regular exercise, a healthy diet (including essential omega-3 fatty acids), intellectual stimulation, and increased social interaction. Document Released: 05/01/2013 Document Revised: 01/13/2014 Document Reviewed: 05/01/2013 [facility] Patient Information 2015 [place], [place]. This information is not intended to replace advice given to you by your health care provider. Make sure you discuss any questions you have with your health care provider.

## 2014-04-22 NOTE — Progress Notes (Signed)
   Subjective:  I have seen and examined this patient with Dr Jerilynn Mages. Dianah Field I have discussed with Dr Dianah Field.  I agree with their findings and plans as documented in their [facility] note.  Pt with Mix of urinary urge and obstructive bladder symptoms in setting of history of [place]state [place] with what sounds like Brachytherapy 10 to 15 years ago. He does not have a urologist in [place].  He has Nocturia 5 to 6 times a nite.  Pt's BP diary shows consistent SBPs in 90 to 108 range at 4 am each nite when he gets up to urinate. Advised Patient to stop the Terazosin and start Flomax 0.4 mg daily.  We have made referral to Urology for complicated picture of urgency and obstructive symptoms in setting of prior localized prostate cancer treated with brachytherapy radiation.   Patient score 25 out of 30 on Montreal Cognitive Assessment test.  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  His cognitive and functional abilities will bear monitoring periodically as he is at risk of progression to meet formal diagnosis of Dementia (5 to 10% risk annually).  Patient with poor balance and proximal muscle strength on screening tests today. 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    Pt agreed to outpatient PT consultation of strength, balance training, and possible assistive device recommendation and fitting.   Pt given second Pneumococcal vaccination today.   Over 60 minutes for office visit with greater than 50% spent in couselling and coordination of care.        HPI     Review of Systems     Objective:   Physical Exam        Assessment & Plan:

## 2014-04-22 NOTE — Assessment & Plan Note (Signed)
With frequent urination. Consider BPH vs h/o prostate cancer vs bladder spasm. - Prescribing flomax; call us if this is not working well or you are having abnormal side effects to it. - We are referring you to a urologist.

## 2014-04-22 NOTE — Assessment & Plan Note (Signed)
Chair standing 9 times in 30 seconds is below average for age/sex category. - Please do exercises we discussed today. - We are referring you to PT for rehabilitation exercises and to show you exercises to help deal with your Meniere's disease without medication. - Please stop taking meclizine and terazosin (age and low home BPs overnight in 90-100s SBP. - B12 and vitamin D labs today

## 2014-04-22 NOTE — Progress Notes (Signed)
Patient ID: Jacob Norton, male   DOB: 31-Mar-1935, 79 y.o.   MRN: [id number] [facility]iatric[facility] Clinic Note  Provider: Dr Sherren Mocha McDiarmid, Dr Conni Slipper Location of Care: [facility]  Patient is not accompanied by anyone. Primary caregiver is patient. Patient's lives alone but plans to move in with his son Jacob Norton. Patient information was obtained from patient. History/Exam limitations: none.  Chief Complaint:  Chief Complaint  Patient presents with  . Memory Loss  Frequent nocturia  HISTORY OF PRESENT ILLNESS: Jacob Norton is a 79 y.o. 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  Memory loss - He is noticing this with names of things. - His attorney recommended he ask about namenda and aricept.  Problems with frequent nocturia. - He reports waking 6 time overnight to urinate. - He is trying to drink less to pee less at night. - Urine has become increasingly yellow, noticeable after breakfast until around 2pm when it starts clearing. - He has gotten close to incontinence but has not ever been incontinent of urine or stool. - He stops and starts recently with decreased stream and occasional sudden urge to void, since around 2010 at least. - Denies dysuria. - Takes terazosin.  - H/o prostate cancer, thinks he had radiation seeds 10-15 years ago. No unintentional weight loss.   Patient is not concerned about medication errors, wandering, driving, cooking and inability to maintain adequate nutrition.    Outpatient Encounter Prescriptions as of 04/22/2014  Medication Sig  . aspirin 81 MG tablet Take 81 mg by mouth daily.    Marland Kitchen atenolol (TENORMIN) 25 MG tablet TAKE 1 TABLET (25 MG TOTAL) BY MOUTH DAILY.  . B Complex-C (B-COMPLEX WITH VITAMIN C) tablet Take 1 tablet by mouth daily.  . Cholecalciferol (VITAMIN D3) 5000 UNITS TABS Take by mouth.  . co-enzyme Q-10 30 MG capsule Take 30 mg by  mouth 3 (three) times daily.  . Fish Oil OIL daily.    . hydrocortisone cream 1 % Apply 1 application topically daily.  . lansoprazole (PREVACID) 15 MG capsule Take 15 mg by mouth as needed.   . Multiple Vitamins-Minerals (CENTRUM SILVER PO) Take 1 tablet by mouth daily.    . polyethylene glycol (MIRALAX / GLYCOLAX) packet Take 17 g by mouth daily.  . Probiotic Product (SOLUBLE FIBER/PROBIOTICS PO) Take by mouth.  . Psyllium (METAMUCIL) WAFR Take 1 Wafer by mouth.  . simvastatin (ZOCOR) 80 MG tablet TAKE 40 MG TABLET BY MOUTH AT BEDTIME  . [DISCONTINUED] terazosin (HYTRIN) 5 MG capsule TAKE 1 CAPSULE EVERY DAY  . diflorasone (PSORCON) 0.05 % ointment APPLY TO AFFECTED AREA EVERY DAY  . HYDROcodone-acetaminophen (LORTAB) 7.5-500 MG per tablet Take 1 tablet by mouth every 6 (six) hours as needed for pain.  . meclizine (ANTIVERT) 25 MG tablet TAKE AS NEEDED  . tamsulosin (FLOMAX) 0.4 MG CAPS capsule Take 1 capsule (0.4 mg total) by mouth daily after supper. Take about 30 minutes after the same meal each day.  . [DISCONTINUED] Cholecalciferol (VITAMIN D3) 2000 UNITS capsule Take 2,000 Units by mouth daily.  . [DISCONTINUED] cyanocobalamin 100 MCG tablet Take 100 mcg by mouth daily.     History Past Medical History  Diagnosis Date  . Hypertension   . Hyperlipidemia   . Blood transfusion   . Prostate cancer   . Esophageal stricture 2014    S/P Dilation, Dr Deatra Ina (GI)  . Gastric erosions 2014  EGD Dr Deatra Ina.  Neg H. Pylori and malignancy on bx histopath  Meniere's disease Back pain Infantile paralysis  Past Surgical History  Procedure Laterality Date  . Prostate surgery    . Colon surgery  1991  . Total knee arthroplasty      left  . Cholecystectomy  03/25/11  . Appendectomy  1965  . Coronary artery bypass graft  1994  . Hernia repair  1965  . Coronary angioplasty with stent placement  1994  . Esophageal dilation  2014    Dr Deatra Ina (GI)  . Cataract extraction Bilateral     Family History  Problem Relation Age of Onset  . Colon cancer Neg Hx   . Esophageal cancer Neg Hx   . Rectal cancer Neg Hx   . Stomach cancer Neg Hx     reports that he quit smoking about 18 years ago. His smoking use included Cigarettes. He smoked 0.00 packs per day. He has never used smokeless tobacco. He reports that he drinks about .5 ounces of alcohol per week. He reports that he does not use illicit drugs. - Highest level of education - junior college <2 years - Owns tattoo shop - [employer] - Exercises/stretches 2-3 times per day - Has long-term care insurance  Activities of Daily Living - Current Assessment walk without assistance do you drive?  yes transfers alone eating, bathing, toileting, personal cares, ambulating, grooming, hygiene, dressing upper body, dressing lower body, meal preparation and taking own medications  Medication administration: patient self medicates.  Overall functional status:  Self-care  Comments:  IADLs: able to travel but needs someone to travel with because trouble walking far distances.  Falls in the past six months:  0  Diet:  general and bowl of ice cream nightly and sausage egg biscuit every day for breakfast Supplemental shakes: None    Filed Vitals:   04/22/14 1333  BP: 138/73  Pulse: 72  Temp: 98.1 F (36.7 C)   Filed Weights   04/22/14 1333  Weight: 195 lb (88.451 kg)    Review of Systems Pertinent items are noted in HPI.    Review of Systems  General: Denies fevers, chills, weight loss, fatigue Eyes: Denies pain, blurred vision, discharge.  Ears/Nose/Throat: Denies ear pain, throat pain, rhinorrhea, nasal congestion.  Cardiovascular: Denies chest pains, palpitations, dyspnea on exertion, orthopnea, peripheral edema.  Respiratory: Denies cough, sputum, dyspnea.  Gastrointestinal: Denies abd pain, bloating, constipation, diarrhea.  Genitourinary: Denies dysuria, discharge, pelvic pain.  Musculoskeletal: Denies joint  pain, swelling, weakness.  Skin: Denies skin rash or ulcers. Neurologic: Denies transient paralysis, weakness, paresthesias, headache.  Psychiatric: Denies depression, anxiety, psychosis. Endocrine: Denies weight change.   PHYSICAL EXAM: General: No acute distress, well nourished, pleasant HEENT:  No scleral icterus, no nasal secretions Neck: Supple RESP: No resp distress or accessory muscle use.   MSK: no joint swelling EXT:    RUE:  5/5 strength, normal ROM at shoulder, elbow, wrist  LUE:  5/5 strength, normal ROM at shoulder, elbow, wrist  RLE:  4/5 strength, normal ROM at hip, knee, ankle; smaller calf and thigh circumference than left  LLE:  5/5 strength, normal ROM at hip, knee, ankle Get up and go: Performed easily, timed at 14 seconds to walk length of room Able to easily stand from seated with hands crossed over chest Able to do 9 times in 30 seconds Balance 10 seconds with feet together, eyes closed, one foot halfway forward. Only 4 seconds with both feet forward  Hands out - no tremor Hands on lap - no tremor No cogwheeling Gait:  Able to ambulate alone, good balance normal gait Skin:  No significant skin lesions or rash Neurologic:  Cranial nerves 2-12 tested and intact, normal tone in extremities, normal strength in extremities Psych:  Fully oriented, Judgment and insight normal, Memory normal for long and short term, Mood and affect appropriate MMSE:   MMSE - Mini Mental State Exam 02/07/2013  Orientation to time 5  Orientation to Place 5  Registration 3  Attention/ Calculation 2  Recall 3  Language- name 2 objects 2  Language- repeat 1  Language- follow 3 step command 3  Language- read & follow direction 1  Write a sentence 1  Copy design 1  Total score 27   Geriatric Depression Scale: 3  Geriatric function sheet:  Problems with hearing identified Problems with seeing at night when raining s/p bilateral cataract surgery Can ID social support - Jacob Norton (son,  who is his HCPOA per pt) Health self-report: Fair No other concerns identified  Assessment and Plan:   Problem List Items Addressed This Visit     Unprioritized   BENIGN PROSTATIC HYPERTROPHY, HX OF   Relevant Orders      Ambulatory referral to Urology   Hearing loss associated with syndrome of both ears - Primary   Personal history of malignant neoplasm of prostate   Relevant Orders      Ambulatory referral to Urology   Post-polio syndrome   Relevant Orders      Referral to Neuro Rehab   Vertigo   Relevant Orders      Referral to Neuro Rehab      Basic Metabolic Panel    Other Visit Diagnoses   Imbalance        Relevant Orders       Referral to Neuro Rehab       Basic Metabolic Panel    Essential hypertension, benign        Relevant Orders       Basic Metabolic Panel    Unspecified vitamin D deficiency        Relevant Orders       Vitamin D, 25-hydroxy    Abnormality of gait        Relevant Orders       Vitamin B12    Need for pneumococcal vaccination        Relevant Orders       Pneumococcal conjugate vaccine 13-valent IM (Completed)      Imbalance and weakness Chair standing 9 times in 30 seconds is below average for age/sex category. - Please do exercises we discussed today. - We are referring you to PT for rehabilitation exercises and to show you exercises to help deal with your Meniere's disease without medication. - Please stop taking meclizine and terazosin (age and low home BPs overnight in 90-100s SBP. - B12 and vitamin D labs today  Difficulty urinating With frequent urination. Consider BPH vs h/o prostate cancer vs bladder spasm. - Prescribing flomax; call us if this is not working well or you are having abnormal side effects to it. - We are referring you to a urologist.  Memory loss Scored 25 on your memory testing - Mild cognitive impairment. - Namenda and Aricept are not appropriate for you to start at this time because of their minimal benefit  and side effects. - Already has living will copied in chart and son Jacob Norton designated as HCPOA per pt. -  Prevnar 13 dosed today  Code Status:  DNR/DNI on review with patient First contact:  Jacob Norton (son)  Follow Up Appointment:  3 months with PCP Dr Erin Hearing.  Hilton Sinclair, MD PGY-3, [facility]

## 2014-04-22 NOTE — Assessment & Plan Note (Signed)
Scored 25 on your memory testing - Mild cognitive impairment. - Namenda and Aricept are not appropriate for you to start at this time because of their minimal benefit and side effects. - Already has living will copied in chart and son Jacob Norton designated as HCPOA per pt. - Prevnar 13 dosed today

## 2014-04-23 ENCOUNTER — Encounter: Payer: Self-pay | Admitting: Family Medicine

## 2014-04-23 ENCOUNTER — Ambulatory Visit (INDEPENDENT_AMBULATORY_CARE_PROVIDER_SITE_OTHER): Payer: Medicare Other | Admitting: *Deleted

## 2014-04-23 VITALS — BP 118/76

## 2014-04-23 DIAGNOSIS — Z136 Encounter for screening for cardiovascular disorders: Secondary | ICD-10-CM

## 2014-04-23 DIAGNOSIS — Z0130 Encounter for examination of blood pressure without abnormal findings: Secondary | ICD-10-CM

## 2014-04-23 LAB — BASIC METABOLIC PANEL
BUN: 25 mg/dL — AB (ref 6–23)
CO2: 28 mEq/L (ref 19–32)
Calcium: 10.2 mg/dL (ref 8.4–10.5)
Chloride: 99 mEq/L (ref 96–112)
Creat: 1.24 mg/dL (ref 0.50–1.35)
Glucose, Bld: 95 mg/dL (ref 70–99)
Potassium: 4.2 mEq/L (ref 3.5–5.3)
SODIUM: 136 meq/L (ref 135–145)

## 2014-04-23 LAB — VITAMIN B12: VITAMIN B 12: 533 pg/mL (ref 211–911)

## 2014-04-23 LAB — VITAMIN D 25 HYDROXY (VIT D DEFICIENCY, FRACTURES): VIT D 25 HYDROXY: 63 ng/mL (ref 30–89)

## 2014-04-23 NOTE — Progress Notes (Signed)
  Pt in nurse clinic for blood pressure check.  BP 118/76 manually.  Pt denies any symptoms today.  [name], RN

## 2014-04-24 ENCOUNTER — Encounter: Payer: Self-pay | Admitting: *Deleted

## 2014-04-24 ENCOUNTER — Encounter: Payer: Self-pay | Admitting: Family Medicine

## 2014-04-24 NOTE — Progress Notes (Signed)
Prior Authorization received from [facility] for Tamsulosin 0.4 mg cap. PA form completed and faxed to [facility] for review. [name], RN

## 2014-04-25 NOTE — Progress Notes (Signed)
PA for Tamsulosin 0.4 mg approved from [facility] until 06/24/2014.  [facility] aware of approval.  [name], RN

## 2014-05-03 ENCOUNTER — Encounter: Payer: Self-pay | Admitting: Gastroenterology

## 2014-05-09 ENCOUNTER — Ambulatory Visit (HOSPITAL_COMMUNITY)
Admission: RE | Admit: 2014-05-09 | Discharge: 2014-05-09 | Disposition: A | Discharge status: Home / Self Care | Payer: Medicare Other | Source: Ambulatory Visit | Attending: Family Medicine | Admitting: Family Medicine

## 2014-05-09 ENCOUNTER — Ambulatory Visit (INDEPENDENT_AMBULATORY_CARE_PROVIDER_SITE_OTHER): Payer: Medicare Other | Admitting: Family Medicine

## 2014-05-09 ENCOUNTER — Telehealth: Payer: Self-pay | Admitting: Family Medicine

## 2014-05-09 ENCOUNTER — Encounter: Payer: Self-pay | Admitting: Family Medicine

## 2014-05-09 VITALS — BP 127/81 | HR 65 | Temp 98.2°F | Ht 65.0 in | Wt 192.0 lb

## 2014-05-09 DIAGNOSIS — I951 Orthostatic hypotension: Secondary | ICD-10-CM | POA: Diagnosis not present

## 2014-05-09 DIAGNOSIS — R42 Dizziness and giddiness: Secondary | ICD-10-CM

## 2014-05-09 NOTE — Telephone Encounter (Signed)
Pt stated for the past three days he has been very dizzy and has fallen down once.  Appt made for today at 4:15 PM with same day doctor.  [name], RN

## 2014-05-09 NOTE — Progress Notes (Signed)
    Subjective   Jacob Norton is a 79 y.o. male that presents for a same day visit  1. Lightheadedness: Started a few days ago. States no vertigo. Worse when he stands up and moves. He had fall yesterday but caught himself. Did not hit head. No loss of consciousness. Says he was off balance. Has history is Meniere's disease and used to take Meclizine. No chest pain. No shortness of breath. No nausea or vomiting. New medication includes Tamsulosin which was started earlier this month. He thought it was because his "low number" of his blood pressure was too low. He states he did not have trouble voiding before and is now voiding a lot.  History  Substance Use Topics  . Smoking status: Former Smoker    Types: Cigarettes    Quit date: 09/13/1995  . Smokeless tobacco: Never Used     Comment: Smokes half a cigar once a month  . Alcohol Use: 0.5 oz/week    1 drink(s) per week     Comment: rarely    ROS Per HPI  Objective   BP 127/81  Pulse 65  Temp(Src) 98.2 F (36.8 C) (Oral)  Ht 5\' 5"  (1.651 m)  Wt 192 lb (87.091 kg)  BMI 31.95 kg/m2  Orthostatic vitals Supine: BP: 188/78 P: 65 Sitting:  BP: 127/81 P: 76 Standing: BP 133/73 P: 81  General: Well appearing, in no distress Eyes: No nystagmus Respiratory/Chest: Clear to auscultation bilaterally Cardiovascular: Regular rate and rhythm, no murmur Neuro: Alert and oriented  EKG: Normal sinus rhythm, left bundle branch block, no significant change from previous tracing.  Assessment and Plan   Please refer to problem based charting of assessment and plan

## 2014-05-09 NOTE — Patient Instructions (Signed)
Thank you for coming to see me today. It was a pleasure. Today we talked about:   Lightheadedness: Please stop taking the Flomax as this may be causing your symptoms. Please make an appointment with Dr. Erin Hearing next week.  If you have any questions or concerns, please do not hesitate to call the office at [phone].  Sincerely,  Cordelia Poche, MD

## 2014-05-09 NOTE — Assessment & Plan Note (Addendum)
tamsulosin may be contributing to symptoms since recently started. Vitals consistent with orthostatic hypotension. Advised holding medication over the weekend. Will follow-up next week to reassess and see if a change in medication is warranted. EKG without arrythmia. Did not hear a murmur.

## 2014-05-09 NOTE — Telephone Encounter (Signed)
Pt called and is concerned about the dizzy spells he is having a night. His medication was changed and he is not sure if that is what it from. He would like to talk to a nurse concerning this. jw

## 2014-05-16 ENCOUNTER — Ambulatory Visit (INDEPENDENT_AMBULATORY_CARE_PROVIDER_SITE_OTHER): Payer: Medicare Other | Admitting: Family Medicine

## 2014-05-16 ENCOUNTER — Encounter: Payer: Self-pay | Admitting: Family Medicine

## 2014-05-16 VITALS — BP 136/72 | HR 62 | Temp 98.7°F | Ht 65.0 in | Wt 192.0 lb

## 2014-05-16 DIAGNOSIS — Z23 Encounter for immunization: Secondary | ICD-10-CM

## 2014-05-16 DIAGNOSIS — S91109A Unspecified open wound of unspecified toe(s) without damage to nail, initial encounter: Secondary | ICD-10-CM

## 2014-05-16 DIAGNOSIS — I951 Orthostatic hypotension: Secondary | ICD-10-CM

## 2014-05-16 DIAGNOSIS — S91209A Unspecified open wound of unspecified toe(s) with damage to nail, initial encounter: Secondary | ICD-10-CM

## 2014-05-16 DIAGNOSIS — Z87898 Personal history of other specified conditions: Secondary | ICD-10-CM

## 2014-05-16 NOTE — Patient Instructions (Signed)
For your bladder/prostate, you can start taking the Myrbetriq to see if it will help with your symptoms. Continue to take your blood pressure measurement twice a day. If you start to have very high or very low, please call the clinic.   For your toenail, the nail is almost off itself. You can use a nail clipper and cut it further so that it doesn't get caught on anything, and if it doesn't fall off over the next few weeks you can come back to the clinic and have the toenail removed.

## 2014-05-16 NOTE — Assessment & Plan Note (Signed)
Patient with partial avulsion, hanging on only minimally but enough that to remove in office would probably require anesthetic. Discussed with patient that he can wait it out and see if it will fall off on its own over the next few weeks or schedule to come back to the office to have it removed.

## 2014-05-16 NOTE — Progress Notes (Signed)
Patient ID: Jacob Norton, male   DOB: 1935/08/30, 79 y.o.   MRN: [id number]   Subjective:    Patient ID: Jacob Norton, male    DOB: 1935/03/24, 79 y.o.   MRN: [id number]  HPI  CC: follow up  # Dizziness:  Seen in clinic last week for dizziness, suspected related to starting flomax. Advised to stop flomax at that time.  Patient has had no further episodes of dizziness. ROS: no CP, no SOB, no changes in vision  # BPH/bladder symptoms  Patient seen by urologist and offered prescription of Myrbetriq which he has not yet started  He does not say he has problems with starting/stopping stream or urinary leakage in between voids  # Toenail injury  Hit his right pinky toe and pulled the nail back  No bleeding, thinks toenail is starting to grow back  Review of Systems   See HPI for ROS. All other systems reviewed and are negative.  Past medical history, surgical, family, and social history reviewed and updated in the EMR. No new updates were made today. Objective:  BP 136/72  Pulse 62  Temp(Src) 98.7 F (37.1 C) (Oral)  Ht 5\' 5"  (1.651 m)  Wt 192 lb (87.091 kg)  BMI 31.95 kg/m2 Vitals reviewed  General: NAD CV: RRR, normal heart sounds, no murmurs appreciated. 2+ radial pulses bilaterally Resp: CTAB, normal effort Ext: right 5th toe with nail 90% loose from nailbed, no tenderness to area, no erythema or evidence of infection.  Assessment & Plan:  See Problem List Documentation

## 2014-05-16 NOTE — Assessment & Plan Note (Signed)
Resolved symptoms after stopping flomax. Patient reports mild symptoms to me. Seen by urologist and will start mybetriq. Patient to continue taking his BP 2 times daily. Asked him to call clinic if BP elevated >833 systolic, diastolic >825.

## 2014-05-16 NOTE — Assessment & Plan Note (Signed)
Evaluated by urologist, plans to start Myrbetriq

## 2014-06-02 ENCOUNTER — Other Ambulatory Visit: Payer: Self-pay | Admitting: Family Medicine

## 2014-06-04 ENCOUNTER — Telehealth: Payer: Self-pay | Admitting: Family Medicine

## 2014-06-04 NOTE — Telephone Encounter (Signed)
Pt is aware of this and will bring medication when he comes in.  States that he was told to hold his flomax.  Jazmin Hartsell,CMA

## 2014-06-04 NOTE — Telephone Encounter (Signed)
If he was not taking it he should not get the refill.  Not sure why the pharmacy requested a refill?  He should come in for an office visit and bring all his medications in the next few weeks  thanskn  [place]

## 2014-06-04 NOTE — Telephone Encounter (Signed)
Pt called and received a refill on his Terazosin. He thought that he was off of this medication and wants to make sure before picking this up and taking it. Please call to let him know. jw

## 2014-06-09 ENCOUNTER — Encounter: Payer: Self-pay | Admitting: Family Medicine

## 2014-06-23 ENCOUNTER — Encounter: Payer: Self-pay | Admitting: Family Medicine

## 2014-06-23 ENCOUNTER — Ambulatory Visit (INDEPENDENT_AMBULATORY_CARE_PROVIDER_SITE_OTHER): Payer: Medicare Other | Admitting: Family Medicine

## 2014-06-23 VITALS — BP 134/76 | HR 70 | Temp 99.0°F | Resp 18 | Wt 189.0 lb

## 2014-06-23 DIAGNOSIS — I2510 Atherosclerotic heart disease of native coronary artery without angina pectoris: Secondary | ICD-10-CM

## 2014-06-23 DIAGNOSIS — R443 Hallucinations, unspecified: Secondary | ICD-10-CM | POA: Insufficient documentation

## 2014-06-23 DIAGNOSIS — I10 Essential (primary) hypertension: Secondary | ICD-10-CM

## 2014-06-23 LAB — COMPREHENSIVE METABOLIC PANEL
ALT: 19 U/L (ref 0–53)
AST: 24 U/L (ref 0–37)
Albumin: 4.1 g/dL (ref 3.5–5.2)
Alkaline Phosphatase: 88 U/L (ref 39–117)
BUN: 28 mg/dL — ABNORMAL HIGH (ref 6–23)
CALCIUM: 10.1 mg/dL (ref 8.4–10.5)
CHLORIDE: 99 meq/L (ref 96–112)
CO2: 26 mEq/L (ref 19–32)
CREATININE: 1.47 mg/dL — AB (ref 0.50–1.35)
Glucose, Bld: 108 mg/dL — ABNORMAL HIGH (ref 70–99)
Potassium: 4.6 mEq/L (ref 3.5–5.3)
Sodium: 138 mEq/L (ref 135–145)
Total Bilirubin: 0.8 mg/dL (ref 0.3–1.2)
Total Protein: 6.7 g/dL (ref 6.0–8.3)

## 2014-06-23 LAB — AMMONIA: AMMONIA: 31 umol/L (ref 11–60)

## 2014-06-23 NOTE — Progress Notes (Signed)
   Subjective:    Patient ID: Jacob Norton, male    DOB: 06/10/1935, 79 y.o.   MRN: [id number]  HPI  Hallucinations - Patient accompanied by his son from [place] and his wife For the last several months.  See faces and figures in furniture and objects.   He realizes they are not real.  Cant specifically associate with particular events or medications.  Family has not noticed any unusual behavior or weakness or gait changes. No depressive symptoms No recent trauma or illnesses.  Was recently started on Myrbetriq by urology.  N  PMH -diagnosed an mild cognitive impairment MMS 25 several months ago.  Recently taken off flomax for low blood pressure   Chief Complaint noted Review of Symptoms - see HPI PMH - Smoking status noted.   Vital Signs reviewed  Review of Systems     Objective:   Physical Exam Alert oriented knows medication names and side effects Frequently joking  Psych:  Cognition and judgment appear intact. Alert, communicative  and cooperative with normal attention span and concentration. No apparent delusions, illusions Taking large amount of vitamins Neurologic exam : Cn 2-7 intact Strength equal & normal in upper & lower extremities   Balance normal  Romberg normal, finger to nose normal Heart - Regular rate and rhythm.  No murmurs, gallops or rubs.    Lungs:  Normal respiratory effort, chest expands symmetrically. Lungs are clear to auscultation, no crackles or wheezes.         Assessment & Plan:

## 2014-06-23 NOTE — Assessment & Plan Note (Signed)
Controlled today after episodes of hypotension.  Will stop his very low dose atenolol and monitor his blood pressure

## 2014-06-23 NOTE — Assessment & Plan Note (Signed)
New onset.  Seems isolated focal visual hallucinations.  No other noted cognitive or functional limitations.  No obvious medication cause. Will check labs and hold all unnecessary medications including atenolol (maybe related to low blood pressure) No focal findings on neuro exam so will not check neuro imaging unless persists or worsens

## 2014-06-23 NOTE — Patient Instructions (Addendum)
Good to see you today!  Thanks for coming in.  Stop all your vitamins  Stop taking the atenolol - call if your blood pressure goes up or if you have any chest pain   Keep taking prevacid and aspirin  Go off the Myrbetriq for one week and call me  I will call you if your lab tests are not normal.  Otherwise we will discuss them at your next visit.  Come back in 2-3 weeks Bring your blood pressure readings

## 2014-06-24 ENCOUNTER — Telehealth: Payer: Self-pay | Admitting: Family Medicine

## 2014-06-24 MED ORDER — AMLODIPINE BESYLATE 5 MG PO TABS: 5.0000 mg | ORAL_TABLET | Freq: Every day | ORAL | Status: DC

## 2014-06-24 MED ORDER — ATORVASTATIN CALCIUM 40 MG PO TABS: 40.0000 mg | ORAL_TABLET | Freq: Every day | ORAL | Status: DC

## 2014-06-24 NOTE — Telephone Encounter (Signed)
Pt called emergency line about his blood pressure. Reports that his blood pressure had gone up to 178/97 today so he called [facility] (PCP) and was given norvasc 5mg . After taking it this evening his BP dropped to 116/67 and he is wondering if this is ok. He denies dizziness, lightheadedness, vision changes. He reports he feels well but was concerned because the change was so dramatic. Informed patient that this number was fine as long as he continued to feel well. Counseled him to seek care if he starts to feel dizzy or lightheaded. Call [facility] in a week with update per their previous plan.

## 2014-06-24 NOTE — Telephone Encounter (Signed)
He called his blood pressure on his machine was 170/95. He is feeling well  Plan Stop atenolol and simvastatin - he already had  Start Lipitor 40 mg daily Amlodipine 5 mg daily  He will cal with blood pressure and symptoms in a week

## 2014-06-26 ENCOUNTER — Ambulatory Visit (INDEPENDENT_AMBULATORY_CARE_PROVIDER_SITE_OTHER): Payer: Medicare Other | Admitting: *Deleted

## 2014-06-26 VITALS — BP 140/88 | HR 70

## 2014-06-26 DIAGNOSIS — I10 Essential (primary) hypertension: Secondary | ICD-10-CM

## 2014-06-26 NOTE — Progress Notes (Signed)
Patient in today for BP check. Blood pressure check manually in right arm was 140/88. Patient states has has been taking his amlodipine regularly. Patient also brings in a list of recent home blood pressures, list placed in PCP box. Reviewed pressures with Dr. Erin Hearing who states he is happy with his bp right now and for patient to keep follow up scheduled next month. Patient informed of message from PCP, expressed understanding.

## 2014-06-27 ENCOUNTER — Encounter: Payer: Self-pay | Admitting: *Deleted

## 2014-06-27 NOTE — Progress Notes (Signed)
Pt in nurse clinic with concern for his blood pressure and which medications he should be taking.  BP 152/80 manually, heart rate 90.  Pt denies any symptoms.  Pt stated he took his blood pressure medications around 5 AM and blood pressure was very high.  Precept with Dr. Erin Hearing; pt blood pressure is good; review medications.  Pt advised he should continue amLODipine (NORVASC) 5 MG tablet and atorvastatin (LIPITOR) 40 MG tablet.  Pt stated understanding; pt advised to go to urgent care or ED if develop chest pain, SOB, dizziness or numbness/tingling in extremities. [name], RN

## 2014-06-30 ENCOUNTER — Ambulatory Visit (INDEPENDENT_AMBULATORY_CARE_PROVIDER_SITE_OTHER): Payer: Medicare Other | Admitting: *Deleted

## 2014-06-30 VITALS — BP 142/80 | HR 103

## 2014-06-30 DIAGNOSIS — Z136 Encounter for screening for cardiovascular disorders: Secondary | ICD-10-CM

## 2014-06-30 DIAGNOSIS — Z0130 Encounter for examination of blood pressure without abnormal findings: Secondary | ICD-10-CM

## 2014-06-30 NOTE — Progress Notes (Signed)
   Pt in nurse clinic for blood pressure check.  BP 142/80 manually, heart rate 103.  Pt denies any chest pain, SOB, dizziness or headaches.  Pt stated he is taking his medications as prescribed.  Pt also stated he is still having hallucinations.  Pt asked if felt like he was being harmed or felt like harming himself; he denied.  Pt stated that he mostly hallucinate at night.  Lately he sees a women in bed with him that has a silk scarf on, but when he pulls the scarf; she disappears.  Precept with Dr. Erin Hearing; pt stated this is not a new concern.  Concerned was discussed at previous office visit.  Pt advised to keep follow up appt with PCP.  Pt stated understanding.  Will forward to PCP.  [name], RN

## 2014-07-03 ENCOUNTER — Ambulatory Visit (INDEPENDENT_AMBULATORY_CARE_PROVIDER_SITE_OTHER): Payer: Medicare Other | Admitting: *Deleted

## 2014-07-03 VITALS — BP 138/88 | HR 96

## 2014-07-03 DIAGNOSIS — Z136 Encounter for screening for cardiovascular disorders: Secondary | ICD-10-CM

## 2014-07-03 DIAGNOSIS — Z0130 Encounter for examination of blood pressure without abnormal findings: Secondary | ICD-10-CM

## 2014-07-03 NOTE — Progress Notes (Signed)
Patient in today for blood pressure check. Blood pressure taken manually in right arm measured 138/88, pulse 90. Patient instructed again to keep follow up with PCP, expressed understanding.

## 2014-07-11 ENCOUNTER — Other Ambulatory Visit: Payer: Self-pay | Admitting: Family Medicine

## 2014-07-15 ENCOUNTER — Ambulatory Visit (INDEPENDENT_AMBULATORY_CARE_PROVIDER_SITE_OTHER): Payer: Medicare Other | Admitting: *Deleted

## 2014-07-15 VITALS — BP 142/80 | HR 90

## 2014-07-15 DIAGNOSIS — Z0130 Encounter for examination of blood pressure without abnormal findings: Secondary | ICD-10-CM

## 2014-07-15 DIAGNOSIS — Z136 Encounter for screening for cardiovascular disorders: Secondary | ICD-10-CM

## 2014-07-15 NOTE — Progress Notes (Signed)
   Pt in nurse clinic for blood pressure check.  BP 142/80 manually, heart rate 90. Pt has an upcoming appt with PCP 07/16/2014.  [name], RN

## 2014-07-16 ENCOUNTER — Ambulatory Visit (INDEPENDENT_AMBULATORY_CARE_PROVIDER_SITE_OTHER): Payer: Medicare Other | Admitting: Family Medicine

## 2014-07-16 ENCOUNTER — Ambulatory Visit
Admission: RE | Admit: 2014-07-16 | Discharge: 2014-07-16 | Disposition: A | Discharge status: Home / Self Care | Payer: Medicare Other | Source: Ambulatory Visit | Attending: Family Medicine | Admitting: Family Medicine

## 2014-07-16 ENCOUNTER — Encounter: Payer: Self-pay | Admitting: Family Medicine

## 2014-07-16 ENCOUNTER — Other Ambulatory Visit: Payer: Self-pay | Admitting: Family Medicine

## 2014-07-16 VITALS — BP 135/88 | HR 99 | Temp 98.4°F | Ht 65.0 in | Wt 186.0 lb

## 2014-07-16 DIAGNOSIS — I2510 Atherosclerotic heart disease of native coronary artery without angina pectoris: Secondary | ICD-10-CM

## 2014-07-16 DIAGNOSIS — R443 Hallucinations, unspecified: Secondary | ICD-10-CM

## 2014-07-16 DIAGNOSIS — I10 Essential (primary) hypertension: Secondary | ICD-10-CM

## 2014-07-16 DIAGNOSIS — N400 Enlarged prostate without lower urinary tract symptoms: Secondary | ICD-10-CM

## 2014-07-16 MED ORDER — GADOBENATE DIMEGLUMINE 529 MG/ML IV SOLN
17.0000 mL | Freq: Once | INTRAVENOUS | Status: AC | PRN
Start: 2014-07-16 — End: 2014-07-16
  Administered 2014-07-16: 17 mL via INTRAVENOUS

## 2014-07-16 NOTE — Patient Instructions (Signed)
Good to see you today!  Thanks for coming in.  For your Blood pressure: Take 1/2 of the atenolol for 2 days then stop it completely  Call if your blood pressure is > 180/100 either number  Stop   Atorvastatin  Prevacid  Call if you develop any chest pain.  If it is severe go to the ER  Continue the Aspirin  Get the MRI

## 2014-07-16 NOTE — Progress Notes (Signed)
   Subjective:    Patient ID: Jacob Norton, male    DOB: Oct 10, 1934, 79 y.o.   MRN: [id number]  HPI  Hallucinations Still seeing faces mostly at night.  His family feels he is more confused than prior but only mildly.  No new focal motor deficits or falls.  Hypertension Taking only 5 mg of amlodipine.  BPs range from 109/66 to 145/87.  No chest pain at rest or with exertion,  No lightheadness or shortness of breath   Urination Feels is doing well without much hesitancy or decreased stream  His son is working toward having him move to an assisted living facility   Review of Systems     Objective:   Physical Exam  Alert no acute distress Seems to know medications and dosages Oriented to [facility], November, 1215 then corrects to 2015 - some hesitancy Walks with limp on right side - old according ot family,  Able to walk turn and sit without assistance Eye - Pupils Equal Round Reactive to light, Extraocular movements intact,  Able to stand on heels and toes with balance assist Heart - Regular rate and rhythm.  No murmurs, gallops or rubs.    Extremities:  No cyanosis, edema, or deformity noted with good range of motion of all major joints.        Assessment & Plan:

## 2014-07-17 ENCOUNTER — Telehealth: Payer: Self-pay | Admitting: Family Medicine

## 2014-07-17 NOTE — Assessment & Plan Note (Signed)
Seems to be tolerating being off all bladder active medications

## 2014-07-17 NOTE — Assessment & Plan Note (Signed)
Controlled today but seems to be labile by home readings.  Will try tapering him off blood pressure medications and follow.  Watch closely for chest pain

## 2014-07-17 NOTE — Assessment & Plan Note (Signed)
Persistent.  Seemed to get better when he stopped all medications but recurred when he resumed current medications.  Will stop amlodipine and statin since he has no cardiac symptoms to see if they will improve.  Also check MRI for any frontal lobe lesions.  Close follow up for worsening

## 2014-07-17 NOTE — Telephone Encounter (Signed)
Pt brought in form about [facility] Please fax to [facility] when complete

## 2014-07-18 NOTE — Telephone Encounter (Signed)
Clinic portion completed and placed in providers box. Jazmin Hartsell,CMA  

## 2014-07-19 ENCOUNTER — Telehealth: Payer: Self-pay | Admitting: Family Medicine

## 2014-07-19 NOTE — Telephone Encounter (Signed)
After hours call  Pt's son calling back with report of HTN with numbers as high as 150/102. The concerning thing is that his dad has developed mild chest pain that gets slightly worse with exertion. He states he has a Hx of CABG.   He has not taken the pill yet today so I advised him to take teh 1/2 and re-ch BP in about 4 hours. I discussed how concerning his chest pain was at length and recommended that he go to the ED for Tx if his chest pain doesn't resolve with rest or if it worsens.   If his chest pain continues at the present level, resolving at rest, I told them to take both halves and go back to the original dose of norvasc.   His story is consistent with stable angina and he has PCP follow up in 2 days. I feel they understand well when to seek emergency care and welcomed their call back if they have more questions.   Laroy Apple, MD [facility] Resident, PGY-3 07/19/2014, 8:53 AM

## 2014-07-21 ENCOUNTER — Ambulatory Visit (INDEPENDENT_AMBULATORY_CARE_PROVIDER_SITE_OTHER): Payer: Medicare Other | Admitting: Family Medicine

## 2014-07-21 ENCOUNTER — Encounter: Payer: Self-pay | Admitting: Family Medicine

## 2014-07-21 VITALS — BP 139/81 | HR 89 | Temp 98.1°F | Wt 183.0 lb

## 2014-07-21 DIAGNOSIS — R443 Hallucinations, unspecified: Secondary | ICD-10-CM

## 2014-07-21 DIAGNOSIS — I2510 Atherosclerotic heart disease of native coronary artery without angina pectoris: Secondary | ICD-10-CM

## 2014-07-21 DIAGNOSIS — I10 Essential (primary) hypertension: Secondary | ICD-10-CM

## 2014-07-21 DIAGNOSIS — E782 Mixed hyperlipidemia: Secondary | ICD-10-CM

## 2014-07-21 NOTE — Patient Instructions (Signed)
Good to see you today!  Thanks for coming in.  High Blood Pressure Take amlodipine 1/2 tab every day Have [facility] measure your blood pressure once a day.  Call me if regularly > 150/100 or < 90/60  After one week restart the Atorvastatin one daily  Take the prevacid as needed for heart burn  Call me if you are experiencing more chest pain or if your shoulder is getting worse  Come back before Thanksgiving and bring all your blood pressure readings and medications

## 2014-07-21 NOTE — Progress Notes (Signed)
   Subjective:    Patient ID: Jacob Norton, male    DOB: 1935/01/08, 79 y.o.   MRN: [id number]  HPI  HYPERTENSION Disease Monitoring Home BP Monitoring ranges from 160/100 to 110/60  Chest pain- had an episode 2 days ago - not sure if was due to shoulder pain from fall or something else. Took one more day of 1/2 amlodipine but none since and has not had any more chest pain     Dyspnea- no Medications Compliance-  As above. Lightheadedness-  no  Edema- no ROS - See HPI  PMH Lab Review   POTASSIUM  Date Value Ref Range Status  06/23/2014 4.6 3.5 - 5.3 mEq/L Final   SODIUM  Date Value Ref Range Status  06/23/2014 138 135 - 145 mEq/L Final   CREATININE  Date Value Ref Range Status  04/22/2014 1.2 .6 - 1.3 mg/dL Final   CREAT  Date Value Ref Range Status  06/23/2014 1.47* 0.50 - 1.35 mg/dL Final   CREATININE, SER  Date Value Ref Range Status  07/10/2013 1.3 0.4 - 1.5 mg/dL Final       Hallucinations Still seeing small men but understands they are not there and feels he mistakes other objects for them.  Have decreased some in frequency since stopped many of his medications  Shoulder Injury Tripped over dog 3 days ago.  Caught by son and did not hit ground. Thinks wrenched his left shoulder.  Normal grip and sensation Is getting better last 2 days  Chief Complaint noted Review of Symptoms - see HPI PMH - Smoking status noted.   Vital Signs reviewed    Review of Systems     Objective:   Physical Exam  Alert no acute distress Knows situation and medications R shoulder - nearly full range of motion with pain at extremes.  Distal grip normal Pain and give way with empty can testing No focal pain or crepitus on palpation of shoulder or elbow Heart - Regular rate and rhythm.  No murmurs, gallops or rubs.    Lungs:  Normal respiratory effort, chest expands symmetrically. Lungs are clear to auscultation, no crackles or wheezes.       Assessment & Plan:    Shoulder  Strain - no signs of fracture or significant acute tear.  Taught range of motion exercises

## 2014-07-21 NOTE — Assessment & Plan Note (Signed)
Blood pressure still somewhat labile.  He is now in an ALF who can help monitor his blood pressure.  Given occasional high readings and history of coronary artery disease will continue low dose amlodipine and asa

## 2014-07-21 NOTE — Assessment & Plan Note (Signed)
Stable.  MRI and labs work up unrevealing for acute reversible cause.  Will monitor

## 2014-07-21 NOTE — Assessment & Plan Note (Signed)
Will try a delayed  Restart of statin to see if he can tolerate

## 2014-07-25 ENCOUNTER — Other Ambulatory Visit: Payer: Self-pay | Admitting: *Deleted

## 2014-07-25 MED ORDER — AMLODIPINE BESYLATE 5 MG PO TABS: 2.5000 mg | ORAL_TABLET | Freq: Every day | ORAL | Status: DC

## 2014-07-29 ENCOUNTER — Telehealth: Payer: Self-pay | Admitting: Family Medicine

## 2014-07-29 NOTE — Telephone Encounter (Signed)
Pt called and would like to speak to Dr. Erin Hearing concerning his medications. He is unsure what medications he is suppose to be taking. jw

## 2014-07-29 NOTE — Telephone Encounter (Signed)
Will forward to MD. Jazmin Hartsell,CMA  

## 2014-07-29 NOTE — Telephone Encounter (Signed)
Spoke w Mr Antonelli who wants me to call to check on his medications.  Was told to call  Call Nurse - [address] [facility] [phone]  Ext [phone] Called has been dc'd  Will try main number for [facility] in AM [phone]

## 2014-07-31 NOTE — Telephone Encounter (Signed)
Called main number It rang > 15 times then went to a recording  Called back transferred to Lanny Cramp and got answering machine  Sent email back to son  -----------------------------------------------------------------------  Their main number I have is [phone]. I think you are looking for the head of their nursing program, her name is Mia but I don't have a contact for her at the moment. Maybe one of the [facility] staff can provide it for [place]?  [place], [employer] Email: [email or web address]  [employer] ticket Office: [phone]  From: Azzie Roup  Sent: Wednesday, July 30, 2014 1:12 PM To: Delories Heinz, [email or web address]> Subject: RE: Is there a change?  He called and left what I guess was his cell and I called yesterday and talked with him.  Could not get a nurse  Tried to call the main number today at [facility] and was transferred several times but did not get to whoever gives his meds.  Can you send me the direct number?  His meds should be  of a 5 mg amlodipine, atorvastatin 40 mg each once daily and prevacid 15 mg as needed once daily  I think it was mentioned that folks from [facility] wanted to attend an office visit?  If they could do so with him in the next 1-2 weeks that would be great  Thanks

## 2014-08-16 ENCOUNTER — Other Ambulatory Visit: Payer: Self-pay | Admitting: Family Medicine

## 2014-09-02 ENCOUNTER — Other Ambulatory Visit: Payer: Self-pay | Admitting: Nurse Practitioner

## 2014-09-02 DIAGNOSIS — M25512 Pain in left shoulder: Secondary | ICD-10-CM

## 2014-09-11 ENCOUNTER — Other Ambulatory Visit: Payer: Self-pay | Admitting: Nurse Practitioner

## 2014-09-11 DIAGNOSIS — G8929 Other chronic pain: Secondary | ICD-10-CM

## 2014-09-11 DIAGNOSIS — M545 Low back pain: Principal | ICD-10-CM

## 2014-09-17 ENCOUNTER — Other Ambulatory Visit: Payer: Medicare Other

## 2014-09-24 ENCOUNTER — Ambulatory Visit
Admission: RE | Admit: 2014-09-24 | Discharge: 2014-09-24 | Disposition: A | Discharge status: Home / Self Care | Payer: Medicare Other | Source: Ambulatory Visit | Attending: Nurse Practitioner | Admitting: Nurse Practitioner

## 2014-09-24 DIAGNOSIS — M25512 Pain in left shoulder: Secondary | ICD-10-CM

## 2014-09-24 DIAGNOSIS — M545 Low back pain: Principal | ICD-10-CM

## 2014-09-24 DIAGNOSIS — G8929 Other chronic pain: Secondary | ICD-10-CM

## 2014-10-10 ENCOUNTER — Encounter: Payer: Self-pay | Admitting: Cardiovascular Disease

## 2014-10-10 ENCOUNTER — Ambulatory Visit (INDEPENDENT_AMBULATORY_CARE_PROVIDER_SITE_OTHER): Payer: Medicare Other | Admitting: Cardiovascular Disease

## 2014-10-10 VITALS — BP 146/82 | HR 84 | Ht 67.0 in | Wt 185.0 lb

## 2014-10-10 DIAGNOSIS — E782 Mixed hyperlipidemia: Secondary | ICD-10-CM

## 2014-10-10 DIAGNOSIS — Z951 Presence of aortocoronary bypass graft: Secondary | ICD-10-CM

## 2014-10-10 DIAGNOSIS — I10 Essential (primary) hypertension: Secondary | ICD-10-CM

## 2014-10-10 DIAGNOSIS — R443 Hallucinations, unspecified: Secondary | ICD-10-CM

## 2014-10-10 NOTE — Progress Notes (Signed)
Patient ID: Jacob Norton, male   DOB: February 15, 1935, 80 y.o.   MRN: [id number] Jacob Norton is seen today for F/U. He has distant history of CAD with CABG in the 80's. he had a normal myovue in 4/10 and has normal EF. He has had a rough 2011 with his son being killed and his wife dying a month of chronic lung disease. He denies SSCP, palpitations, dyspnea or syncope. He still has 3 children living but gets bored at night. He has been compliant with his meds and is sedantary except for walking He is tolerating 40 mg of zocor with no leg pain   Has had hallucinations and memory problems and now in assisted living    ROS: Denies fever, malais, weight loss, blurry vision, decreased visual acuity, cough, sputum, SOB, hemoptysis, pleuritic pain, palpitaitons, heartburn, abdominal pain, melena, lower extremity edema, claudication, or rash.  All other systems reviewed and negative  General: Affect appropriate Healthy:  appears stated age 80: normal Neck supple with no adenopathy JVP normal no bruits no thyromegaly Lungs clear with no wheezing and good diaphragmatic motion Heart:  S1/S2 no murmur, no rub, gallop or click PMI normal Abdomen: benighn, BS positve, no tenderness, no AAA no bruit.  No HSM or HJR Distal pulses intact with no bruits No edema Neuro non-focal Skin warm and dry No muscular weakness   Current Outpatient Prescriptions  Medication Sig Dispense Refill  . amLODipine (NORVASC) 5 MG tablet Take 0.5 tablets (2.5 mg total) by mouth daily. 30 tablet 0  . aspirin 81 MG tablet Take 81 mg by mouth daily.      Marland Kitchen atorvastatin (LIPITOR) 40 MG tablet Take 1 tablet (40 mg total) by mouth daily. 90 tablet 3  . Cholecalciferol (VITAMIN D3) 5000 UNITS TABS Take by mouth.    . hydrocortisone cream 1 % Apply 1 application topically daily.    . lansoprazole (PREVACID) 15 MG capsule Take 15 mg by mouth as needed.     . Multiple Vitamins-Minerals (CENTRUM SILVER PO) Take 1 tablet by mouth daily.      .  Psyllium (METAMUCIL) WAFR Take 1 Wafer by mouth.     No current facility-administered medications for this visit.    Allergies  Review of patient's allergies indicates no known allergies.  Electrocardiogram: NSR rate 67 LAD ICRBBB poor R wave progression  05/09/14   Assessment and Plan

## 2014-10-10 NOTE — Assessment & Plan Note (Signed)
Stable with no angina and good activity level.  Continue medical Rx  

## 2014-10-10 NOTE — Patient Instructions (Signed)
Your physician wants you to follow-up in: YEAR WITH DR NISHAN  You will receive a reminder letter in the mail two months in advance. If you don't receive a letter, please call our office to schedule the follow-up appointment.  Your physician recommends that you continue on your current medications as directed. Please refer to the Current Medication list given to you today. 

## 2014-10-10 NOTE — Assessment & Plan Note (Signed)
Well controlled.  Continue current medications and low sodium Dash type diet.    

## 2014-10-10 NOTE — Assessment & Plan Note (Signed)
Cholesterol is at goal.  Continue current dose of statin and diet Rx.  No myalgias or side effects.  F/U  LFT's in 6 months. Lab Results  Component Value Date   LDLCALC 73 03/28/2013

## 2014-10-10 NOTE — Assessment & Plan Note (Signed)
F/U neuro Gladd to see he is living in safer environment

## 2015-10-02 ENCOUNTER — Encounter: Payer: Self-pay | Admitting: Cardiovascular Disease

## 2015-11-13 ENCOUNTER — Encounter: Payer: Self-pay | Admitting: Gastroenterology

## 2015-11-19 NOTE — Progress Notes (Signed)
Patient ID: Jacob Norton, male   DOB: 03/10/1935, 81 y.o.   MRN: [id number]   Jacob Norton is seen today for F/U. He has distant history of CAD with CABG in the 80's. he had a normal myovue in 4/10 and has normal EF. He has had a rough 2011 with his son being killed and his wife dying a month of chronic lung disease. He denies SSCP, palpitations, dyspnea or syncope. He still has 3 children living but gets bored at night. He has been compliant with his meds and is sedantary except for walking He is tolerating 40 mg of zocor with no leg pain   Has had hallucinations and memory problems and now in assisted living  Doing well at [employer]  Participating in PT/OT  Daughter with him today   ROS: Denies fever, malais, weight loss, blurry vision, decreased visual acuity, cough, sputum, SOB, hemoptysis, pleuritic pain, palpitaitons, heartburn, abdominal pain, melena, lower extremity edema, claudication, or rash.  All other systems reviewed and negative  General: Affect appropriate Healthy:  appears stated age 37: normal  T[place]oo on inner lip "Fuck You"   Neck supple with no adenopathy JVP normal no bruits no thyromegaly Lungs clear with no wheezing and good diaphragmatic motion Heart:  S1/S2 no murmur, no rub, gallop or click PMI normal Abdomen: benighn, BS positve, no tenderness, no AAA no bruit.  No HSM or HJR Distal pulses intact with no bruits No edema Neuro non-focal Skin warm and dry No muscular weakness   Current Outpatient Prescriptions  Medication Sig Dispense Refill  . acetaminophen (TYLENOL) 650 MG CR tablet Take 650 mg by mouth 2 (two) times daily.    Marland Kitchen amLODipine (NORVASC) 5 MG tablet Take 5 mg by mouth daily.    Marland Kitchen aspirin 81 MG tablet Take 81 mg by mouth daily.      . cetirizine (ZYRTEC) 5 MG tablet Take 5 mg by mouth daily.    Marland Kitchen donepezil (ARICEPT) 5 MG tablet Take 5 mg by mouth at bedtime.    Marland Kitchen losartan (COZAAR) 50 MG tablet Take 50 mg by mouth daily.    . mirtazapine (REMERON)  7.5 MG tablet Take 7.5 mg by mouth at bedtime.    . Multiple Vitamins-Minerals (CENTRUM SILVER PO) Take 1 tablet by mouth daily.      Marland Kitchen OVER THE COUNTER MEDICATION Take 2 tablets by mouth daily. OTC stool softner    . QUEtiapine (SEROQUEL) 25 MG tablet Take 25 mg by mouth as directed.    . senna (SENOKOT) 8.6 MG TABS tablet Take 1 tablet by mouth 3 (three) times a week.     No current facility-administered medications for this visit.    Allergies  Review of patient's allergies indicates no known allergies.  Electrocardiogram:  05/09/14  NSR rate 67 LAD ICRBBB poor R wave progression   11/24/15  SR ate 74  LAD poor R wave progression  Assessment and Plan CAD/CABG: distant  No angina Myovue normal 2010  Continue medical Rx Chol:  Recent labs with primary  Cholesterol is at goal.  Continue current dose of statin and diet Rx.  No myalgias or side effects.  F/U  LFT's in 6 months. Lab Results  Component Value Date   LDLCALC 73 03/28/2013   HTN:  Well controlled.  Continue current medications and low sodium Dash type diet.   Dementia:         Jacob Norton

## 2015-11-20 ENCOUNTER — Encounter: Payer: Self-pay | Admitting: Cardiovascular Disease

## 2015-11-23 ENCOUNTER — Encounter: Payer: Self-pay | Admitting: *Deleted

## 2015-11-24 ENCOUNTER — Encounter: Payer: Self-pay | Admitting: Cardiovascular Disease

## 2015-11-24 ENCOUNTER — Ambulatory Visit (INDEPENDENT_AMBULATORY_CARE_PROVIDER_SITE_OTHER): Payer: Medicare Other | Admitting: Cardiovascular Disease

## 2015-11-24 VITALS — HR 74 | Ht 62.0 in | Wt 196.4 lb

## 2015-11-24 DIAGNOSIS — I10 Essential (primary) hypertension: Secondary | ICD-10-CM | POA: Diagnosis not present

## 2015-11-24 DIAGNOSIS — E782 Mixed hyperlipidemia: Secondary | ICD-10-CM | POA: Diagnosis not present

## 2015-11-24 NOTE — Patient Instructions (Signed)

## 2016-01-19 ENCOUNTER — Ambulatory Visit: Payer: Medicare Other | Admitting: Gastroenterology

## 2016-03-16 ENCOUNTER — Ambulatory Visit (INDEPENDENT_AMBULATORY_CARE_PROVIDER_SITE_OTHER): Payer: Medicare Other | Admitting: Gastroenterology

## 2016-03-16 ENCOUNTER — Encounter: Payer: Self-pay | Admitting: Gastroenterology

## 2016-03-16 VITALS — BP 138/62 | HR 72 | Ht 65.0 in | Wt 195.0 lb

## 2016-03-16 DIAGNOSIS — Z85038 Personal history of other malignant neoplasm of large intestine: Secondary | ICD-10-CM

## 2016-03-16 DIAGNOSIS — M6208 Separation of muscle (nontraumatic), other site: Secondary | ICD-10-CM | POA: Diagnosis not present

## 2016-03-16 NOTE — Progress Notes (Signed)
HPI :  81 y/o male here to follow up and discuss surveillance colonoscopy. He is a former patient of Dr. Deatra Ina and new to me.   He reports a remote history of colon cancer s/p resection. No trouble with his bowels. He has rare constipation. He has rare blood noted on the toilet paper but not in his stool. He has roughly one BM per day or so. If takes miralax PRN if needed. No weight loss. He also has a history of prostate cancer treated with XRT. Colon cancer was diagnosed early 1990s. He had treatment with surgery only.  Last colonoscopy 2012. He denies any history of anemia.   He has a history of CABG in 1990s or so. No trouble with chest pains or shortness of breath.   He otherwise has had a prior history of esophageal stricture s/p dilation with benefit. He denies dysphagia at present time. He has no complaints today other than abdominal wall hernia which does not cause him any pain.   Colonoscopy 09/24/10 - 61mm transverse TA, radiation proctitis EGD 4/14 - stricture GEJ, dilated to 32Fr Venia Minks  Past Medical History  Diagnosis Date  . Hypertension   . Hyperlipidemia   . Blood transfusion   . Prostate cancer ([place])   . Esophageal stricture 2014    S/P Dilation, Dr Deatra Ina (GI)  . Gastric erosions 2014    EGD Dr Deatra Ina.  Neg H. Pylori and malignancy on bx histopath  . 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    . Colon cancer [facility])      Past Surgical History  Procedure Laterality Date  . Prostate surgery    . Colon surgery  1991  . Total knee arthroplasty      left  . Cholecystectomy  03/25/11  . Appendectomy  1965  . Coronary artery bypass graft  1994  . Hernia repair  1965  . Coronary angioplasty with stent placement  1994  . Esophageal dilation  2014    Dr Deatra Ina (GI)  . Cataract extraction Bilateral    Family History  Problem Relation Age of Onset  . Colon cancer Neg Hx   . Esophageal cancer Neg Hx   . Rectal cancer Neg Hx   . Stomach  cancer Neg Hx    Social History  Substance Use Topics  . Smoking status: Former Smoker    Types: Cigarettes    Quit date: 09/13/1995  . Smokeless tobacco: Never Used  . Alcohol Use: 0.5 oz/week    1 drink(s) per week     Comment: rarely   Current Outpatient Prescriptions  Medication Sig Dispense Refill  . acetaminophen (TYLENOL) 650 MG CR tablet Take 650 mg by mouth 2 (two) times daily.    Marland Kitchen amLODipine (NORVASC) 5 MG tablet Take 5 mg by mouth daily.    Marland Kitchen aspirin 81 MG tablet Take 81 mg by mouth daily.      . cetirizine (ZYRTEC) 5 MG tablet Take 5 mg by mouth daily.    Marland Kitchen donepezil (ARICEPT) 5 MG tablet Take 5 mg by mouth at bedtime.    Marland Kitchen losartan (COZAAR) 50 MG tablet Take 25 mg by mouth daily.     . mirtazapine (REMERON) 7.5 MG tablet Take 7.5 mg by mouth at bedtime.    . Multiple Vitamins-Minerals (CENTRUM SILVER PO) Take 1 tablet by mouth daily.      Marland Kitchen OVER THE COUNTER MEDICATION Take 2 tablets by mouth daily.  OTC stool softner    . QUEtiapine (SEROQUEL) 25 MG tablet 1/2 tablet in am and 1 tablet in pm    . senna (SENOKOT) 8.6 MG TABS tablet Take 1 tablet by mouth 3 (three) times a week.     No current facility-administered medications for this visit.   No Known Allergies   Review of Systems: All systems reviewed and negative except where noted in HPI.   Lab Results  Component Value Date   WBC 6.2 07/10/2013   HGB 14.6 07/10/2013   HCT 42.5 07/10/2013   MCV 86.5 07/10/2013   PLT 203.0 07/10/2013    Lab Results  Component Value Date   CREATININE 1.47* 06/23/2014   BUN 28* 06/23/2014   NA 138 06/23/2014   K 4.6 06/23/2014   CL 99 06/23/2014   CO2 26 06/23/2014     Physical Exam: BP 138/62 mmHg  Pulse 72  Ht 5\' 5"  (1.651 m)  Wt 195 lb (88.451 kg)  BMI 32.45 kg/m2 Constitutional: Pleasant,well-developed,male in no acute distress. HEENT: Normocephalic and atraumatic. Conjunctivae are normal. No scleral icterus. Neck supple.  Cardiovascular: Normal rate,  regular rhythm.  Pulmonary/chest: Effort normal and breath sounds normal. No wheezing, rales or rhonchi. Abdominal: Soft, nontender. (+) abdominal wall / diastasis recti hernia, Bowel sounds active throughout. There are no masses palpable. No hepatomegaly. Extremities: no edema Lymphadenopathy: No cervical adenopathy noted. Neurological: Alert and oriented to person place and time. Skin: Skin is warm and dry. No rashes noted. Psychiatric: Normal mood and affect. Behavior is normal.   ASSESSMENT AND PLAN: 81 y/o male with history of colon cancer s/p remote resection, as well as tubular adenoma 5 years ago. He is due for a surveillance colonoscopy at this time, however in light of his age we discussed the risks / benefits to future surveillance exams versus no further surveillance. He reports recent blood work per primary care which did not show any anemia (we will obtain this to confirm) and has no symptoms. Following our discussion his preference is to decline any further surveillance exams if he otherwise feels well which is reasonable. He can follow up if he should develop any concerning symptoms which we reviewed.   He otherwise has a diastasis recti / abdominal wall hernia. It is not causing him any symptoms at present, and would not recommend surgical evaluation unless causing him symptoms. He agreed and will monitor for now, follow up PRN.   Haralson Cellar, MD [facility] Pager [phone]

## 2016-03-16 NOTE — Patient Instructions (Signed)
Follow up as needed

## 2017-04-01 ENCOUNTER — Emergency Department (HOSPITAL_COMMUNITY): Payer: Medicare Other

## 2017-04-01 ENCOUNTER — Emergency Department (HOSPITAL_COMMUNITY)
Admission: EM | Admit: 2017-04-01 | Discharge: 2017-04-01 | Disposition: A | Discharge status: Home / Self Care | Payer: Medicare Other | Attending: Emergency Medicine | Admitting: Emergency Medicine

## 2017-04-01 ENCOUNTER — Encounter (HOSPITAL_COMMUNITY): Payer: Self-pay | Admitting: Nurse Practitioner

## 2017-04-01 DIAGNOSIS — I2510 Atherosclerotic heart disease of native coronary artery without angina pectoris: Secondary | ICD-10-CM | POA: Diagnosis not present

## 2017-04-01 DIAGNOSIS — Z87891 Personal history of nicotine dependence: Secondary | ICD-10-CM | POA: Diagnosis not present

## 2017-04-01 DIAGNOSIS — Z7982 Long term (current) use of aspirin: Secondary | ICD-10-CM | POA: Diagnosis not present

## 2017-04-01 DIAGNOSIS — I10 Essential (primary) hypertension: Secondary | ICD-10-CM | POA: Diagnosis not present

## 2017-04-01 DIAGNOSIS — Z96652 Presence of left artificial knee joint: Secondary | ICD-10-CM | POA: Insufficient documentation

## 2017-04-01 DIAGNOSIS — Z79899 Other long term (current) drug therapy: Secondary | ICD-10-CM | POA: Insufficient documentation

## 2017-04-01 DIAGNOSIS — R531 Weakness: Secondary | ICD-10-CM | POA: Diagnosis present

## 2017-04-01 DIAGNOSIS — Z955 Presence of coronary angioplasty implant and graft: Secondary | ICD-10-CM | POA: Insufficient documentation

## 2017-04-01 DIAGNOSIS — R410 Disorientation, unspecified: Secondary | ICD-10-CM | POA: Insufficient documentation

## 2017-04-01 LAB — CBC WITH DIFFERENTIAL/PLATELET
BASOS PCT: 0 %
Basophils Absolute: 0 10*3/uL (ref 0.0–0.1)
Eosinophils Absolute: 0.2 10*3/uL (ref 0.0–0.7)
Eosinophils Relative: 2 %
HEMATOCRIT: 36.8 % — AB (ref 39.0–52.0)
HEMOGLOBIN: 12.6 g/dL — AB (ref 13.0–17.0)
LYMPHS ABS: 0.8 10*3/uL (ref 0.7–4.0)
Lymphocytes Relative: 12 %
MCH: 30.6 pg (ref 26.0–34.0)
MCHC: 34.2 g/dL (ref 30.0–36.0)
MCV: 89.3 fL (ref 78.0–100.0)
MONOS PCT: 10 %
Monocytes Absolute: 0.7 10*3/uL (ref 0.1–1.0)
NEUTROS ABS: 5.2 10*3/uL (ref 1.7–7.7)
NEUTROS PCT: 76 %
Platelets: 186 10*3/uL (ref 150–400)
RBC: 4.12 MIL/uL — AB (ref 4.22–5.81)
RDW: 12.5 % (ref 11.5–15.5)
WBC: 6.9 10*3/uL (ref 4.0–10.5)

## 2017-04-01 LAB — TROPONIN I

## 2017-04-01 LAB — URINALYSIS, ROUTINE W REFLEX MICROSCOPIC
BILIRUBIN URINE: NEGATIVE
Glucose, UA: NEGATIVE mg/dL
Hgb urine dipstick: NEGATIVE
Ketones, ur: NEGATIVE mg/dL
Leukocytes, UA: NEGATIVE
NITRITE: NEGATIVE
PH: 5 (ref 5.0–8.0)
Protein, ur: NEGATIVE mg/dL
SPECIFIC GRAVITY, URINE: 1.021 (ref 1.005–1.030)

## 2017-04-01 LAB — COMPREHENSIVE METABOLIC PANEL
ALK PHOS: 69 U/L (ref 38–126)
ALT: 21 U/L (ref 17–63)
AST: 24 U/L (ref 15–41)
Albumin: 4.3 g/dL (ref 3.5–5.0)
Anion gap: 9 (ref 5–15)
BILIRUBIN TOTAL: 0.8 mg/dL (ref 0.3–1.2)
BUN: 32 mg/dL — ABNORMAL HIGH (ref 6–20)
CALCIUM: 9.5 mg/dL (ref 8.9–10.3)
CO2: 23 mmol/L (ref 22–32)
CREATININE: 1.67 mg/dL — AB (ref 0.61–1.24)
Chloride: 107 mmol/L (ref 101–111)
GFR calc non Af Amer: 37 mL/min — ABNORMAL LOW (ref >60)
GFR, EST AFRICAN AMERICAN: 43 mL/min — AB (ref >60)
Glucose, Bld: 98 mg/dL (ref 65–99)
Potassium: 4.4 mmol/L (ref 3.5–5.1)
SODIUM: 139 mmol/L (ref 135–145)
TOTAL PROTEIN: 6.7 g/dL (ref 6.5–8.1)

## 2017-04-01 MED ORDER — SODIUM CHLORIDE 0.9 % IV BOLUS (SEPSIS): 1000.0000 mL | Freq: Once | INTRAVENOUS | Status: DC

## 2017-04-01 NOTE — ED Provider Notes (Signed)
[place] DEPT Provider Note   CSN: [id number] Arrival date & time: 04/01/17  [place]     History   Chief Complaint Chief Complaint  Patient presents with  . Weakness & Confusion    HPI Jacob Norton is a 82 y.o. male.  HPI   78yM with with confusion. Not quite self for last several days. Currently being tx'd for UTI. Falling frequently. Denies acute pain from these. Pt says most recently his leg "tightened up" and he was helped to floor. No n/v. No urinary complaints.   Past Medical History:  Diagnosis Date  . Blood transfusion   . Colon cancer ([place])   . Esophageal stricture 2014   S/P Dilation, Dr Deatra Ina (GI)  . Gastric erosions 2014   EGD Dr Deatra Ina.  Neg H. Pylori and malignancy on bx histopath  . 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    . Hyperlipidemia   . Hypertension   . Prostate cancer [facility])     Patient Active Problem List   Diagnosis Date Noted  . Hallucinations 06/23/2014  . Hearing loss associated with syndrome of both ears 04/22/2014  . Imbalance 04/22/2014  . Personal history of colonic polyps 07/10/2013  . GERD (gastroesophageal reflux disease) 05/21/2012  . Vertigo 07/11/2011  . Mixed hyperlipidemia 12/14/2010  . COLON CANCER, HX OF 02/24/2010  . BPH (benign prostatic hyperplasia) 02/23/2010  . Essential hypertension 11/19/2008  . CORONARY ARTERY DISEASE 11/19/2008  . OSTEOARTHRITIS 11/19/2008  . Personal history of malignant neoplasm of prostate 11/19/2008  . CORONARY ARTERY BYPASS GRAFT, HX OF 11/19/2008  . 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 APPENDECTOMY  1965  . CATARACT EXTRACTION Bilateral   . CHOLECYSTECTOMY  03/25/11  . COLON SURGERY  1991  . CORONARY ANGIOPLASTY WITH STENT PLACEMENT  1994  . CORONARY ARTERY BYPASS GRAFT  1994  . ESOPHAGEAL DILATION  2014   Dr Deatra Ina (GI)  . HERNIA REPAIR  1965  . PROSTATE SURGERY    . TOTAL KNEE ARTHROPLASTY      left       Home Medications    Prior to Admission medications   Medication Sig Start Date End Date Taking? Authorizing Provider  acetaminophen (TYLENOL) 650 MG CR tablet Take 650 mg by mouth 2 (two) times daily.    [provider]  amLODipine (NORVASC) 5 MG tablet Take 5 mg by mouth daily.    [provider]  aspirin 81 MG tablet Take 81 mg by mouth daily.      [provider]  cetirizine (ZYRTEC) 5 MG tablet Take 5 mg by mouth daily.    [provider]  donepezil (ARICEPT) 5 MG tablet Take 5 mg by mouth at bedtime.    [provider]  losartan (COZAAR) 50 MG tablet Take 25 mg by mouth daily.     [provider]  mirtazapine (REMERON) 7.5 MG tablet Take 7.5 mg by mouth at bedtime.    [provider]  Multiple Vitamins-Minerals (CENTRUM SILVER PO) Take 1 tablet by mouth daily.      [provider]  OVER THE COUNTER MEDICATION Take 2 tablets by mouth daily. OTC stool softner    [provider]  QUEtiapine (SEROQUEL) 25 MG tablet 1/2 tablet in am and 1 tablet in pm    [provider]  senna (SENOKOT) 8.6 MG TABS tablet Take 1 tablet by mouth 3 (  three) times a week.    [provider]    Family History Family History  Problem Relation Age of Onset  . Colon cancer Neg Hx   . Esophageal cancer Neg Hx   . Rectal cancer Neg Hx   . Stomach cancer Neg Hx     Social History Social History  Substance Use Topics  . Smoking status: Former Smoker    Types: Cigarettes    Quit date: 09/13/1995  . Smokeless tobacco: Never Used  . Alcohol use 0.5 oz/week    1 Standard drinks or equivalent per week     Comment: rarely     Allergies   Patient has no known allergies.   Review of Systems Review of Systems  All systems reviewed and negative, other than as noted in HPI.   Physical Exam Updated Vital Signs BP (!) 149/85 (BP Location: Left Arm)   Pulse 63   Temp 98.2 F (36.8 C) (Oral)    Resp (!) 22   Ht 5\' 7"  (1.702 m)   Wt 83.9 kg (185 lb)   SpO2 98%   BMI 28.98 kg/m   Physical Exam  Constitutional: He appears well-developed and well-nourished. No distress.  HENT:  Head: Normocephalic and atraumatic.  Eyes: Conjunctivae are normal. Right eye exhibits no discharge. Left eye exhibits no discharge.  Neck: Neck supple.  Cardiovascular: Normal rate, regular rhythm and normal heart sounds.  Exam reveals no gallop and no friction rub.   No murmur heard. Pulmonary/Chest: Effort normal and breath sounds normal. No respiratory distress.  Abdominal: Soft. He exhibits no distension. There is no tenderness.  Musculoskeletal: He exhibits no edema or tenderness.  Neurological: He is alert.  Skin: Skin is warm and dry.  Psychiatric: He has a normal mood and affect. His behavior is normal. Thought content normal.  Nursing note and vitals reviewed.    ED Treatments / Results  Labs (all labs ordered are listed, but only abnormal results are displayed) Labs Reviewed  COMPREHENSIVE METABOLIC PANEL - Abnormal; Notable for the following:       Result Value   BUN 32 (*)    Creatinine, Ser 1.67 (*)    GFR calc non Af Amer 37 (*)    GFR calc Af Amer 43 (*)    All other components within normal limits  CBC WITH DIFFERENTIAL/PLATELET - Abnormal; Notable for the following:    RBC 4.12 (*)    Hemoglobin 12.6 (*)    HCT 36.8 (*)    All other components within normal limits  URINALYSIS, ROUTINE W REFLEX MICROSCOPIC  TROPONIN I    EKG  EKG Interpretation None       Radiology Dg Chest 2 View  Result Date: 04/01/2017 CLINICAL DATA:  Fall, confusion EXAM: CHEST  2 VIEW COMPARISON:  03/23/2011 FINDINGS: Post sternotomy changes with fracture through the for sternal wire as before. No acute pulmonary infiltrate, consolidation, or significant effusion is seen. Minimal atelectasis at the left base. Stable cardiomediastinal silhouette with atherosclerosis. No pneumothorax.  Degenerative changes of the spine and shoulders. IMPRESSION: Minimal atelectasis at the left lung base. Otherwise no radiographic evidence for acute cardiopulmonary abnormality. Electronically Signed   By: Donavan Foil M.D.   On: 04/01/2017 18:13    Procedures Procedures (including critical care time)  Medications Ordered in ED Medications  sodium chloride 0.9 % bolus 1,000 mL (not administered)     Initial Impression / Assessment and Plan / ED Course  I have reviewed the triage  vital signs and the nursing notes.  Pertinent labs & imaging results that were available during my care of the patient were reviewed by me and considered in my medical decision making (see chart for details).     82yM with fall and mild confusion. Related to UTI? Nontoxic. Afebrile. Son says more or less at baseline currently.   Final Clinical Impressions(s) / ED Diagnoses   Final diagnoses:  Generalized weakness    New Prescriptions New Prescriptions   No medications on file     Virgel Manifold, MD 04/16/17 [id number]

## 2017-04-01 NOTE — ED Triage Notes (Signed)
Pt has been presented by [facility] from [facility] for evaluation for reported confusion and frequent falls. Pt has been receiving in facility treatment for UTI and is on his 3rd day of antibiotics. Pt is a good historian and is currently AOx4 reports that, prior to arrival, "his leg muscles tightened up and he requested to be helped to the floor." Reports hx of dizziness and son at bedside remarks on hx of vertigo.

## 2017-04-01 NOTE — ED Notes (Signed)
Bed: OM60 Expected date: 04/01/17 Expected time: 4:38 PM Means of arrival: Ambulance Comments: Weakness/confusion

## 2017-04-01 NOTE — ED Notes (Addendum)
Patient stated he wanted to stay in his shirt and pants. Patient refused gown.

## 2018-03-01 ENCOUNTER — Other Ambulatory Visit: Payer: Self-pay

## 2018-03-01 ENCOUNTER — Emergency Department (HOSPITAL_COMMUNITY)
Admission: EM | Admit: 2018-03-01 | Discharge: 2018-03-01 | Disposition: A | Discharge status: Home / Self Care | Payer: Medicare Other | Attending: Emergency Medicine | Admitting: Emergency Medicine

## 2018-03-01 ENCOUNTER — Emergency Department (HOSPITAL_COMMUNITY): Payer: Medicare Other

## 2018-03-01 ENCOUNTER — Encounter (HOSPITAL_COMMUNITY): Payer: Self-pay

## 2018-03-01 DIAGNOSIS — Z87891 Personal history of nicotine dependence: Secondary | ICD-10-CM | POA: Insufficient documentation

## 2018-03-01 DIAGNOSIS — Z951 Presence of aortocoronary bypass graft: Secondary | ICD-10-CM | POA: Diagnosis not present

## 2018-03-01 DIAGNOSIS — I2510 Atherosclerotic heart disease of native coronary artery without angina pectoris: Secondary | ICD-10-CM | POA: Insufficient documentation

## 2018-03-01 DIAGNOSIS — Y929 Unspecified place or not applicable: Secondary | ICD-10-CM | POA: Insufficient documentation

## 2018-03-01 DIAGNOSIS — I10 Essential (primary) hypertension: Secondary | ICD-10-CM | POA: Insufficient documentation

## 2018-03-01 DIAGNOSIS — Y999 Unspecified external cause status: Secondary | ICD-10-CM | POA: Insufficient documentation

## 2018-03-01 DIAGNOSIS — F0390 Unspecified dementia without behavioral disturbance: Secondary | ICD-10-CM | POA: Diagnosis not present

## 2018-03-01 DIAGNOSIS — S7002XA Contusion of left hip, initial encounter: Secondary | ICD-10-CM | POA: Insufficient documentation

## 2018-03-01 DIAGNOSIS — Y939 Activity, unspecified: Secondary | ICD-10-CM | POA: Insufficient documentation

## 2018-03-01 DIAGNOSIS — S76012A Strain of muscle, fascia and tendon of left hip, initial encounter: Secondary | ICD-10-CM | POA: Insufficient documentation

## 2018-03-01 DIAGNOSIS — W07XXXA Fall from chair, initial encounter: Secondary | ICD-10-CM | POA: Insufficient documentation

## 2018-03-01 DIAGNOSIS — M545 Low back pain: Secondary | ICD-10-CM | POA: Insufficient documentation

## 2018-03-01 DIAGNOSIS — S79912A Unspecified injury of left hip, initial encounter: Secondary | ICD-10-CM | POA: Diagnosis present

## 2018-03-01 DIAGNOSIS — W19XXXA Unspecified fall, initial encounter: Secondary | ICD-10-CM

## 2018-03-01 DIAGNOSIS — Z85038 Personal history of other malignant neoplasm of large intestine: Secondary | ICD-10-CM | POA: Diagnosis not present

## 2018-03-01 MED ORDER — ACETAMINOPHEN 500 MG PO TABS
1000.0000 mg | ORAL_TABLET | Freq: Once | ORAL | Status: AC
  Administered 2018-03-01: 1000 mg via ORAL
  Filled 2018-03-01: qty 2

## 2018-03-01 MED ORDER — ACETAMINOPHEN 500 MG PO TABS: 1000.0000 mg | ORAL_TABLET | Freq: Four times a day (QID) | ORAL | 0 refills | Status: AC | PRN

## 2018-03-01 MED ORDER — TRAMADOL HCL 50 MG PO TABS: ORAL_TABLET | ORAL | 0 refills | Status: AC

## 2018-03-01 NOTE — ED Notes (Signed)
Pt c/o left hip pain with ambulation with walker

## 2018-03-01 NOTE — ED Triage Notes (Signed)
Unwitnessed fall- pt may have fallen out of his chair. Pt c/o lower back pain and hip pain,mostly on left side. No bruising, shortening, or rotation. 166/2 HR 76 RR 18 96% RA 114 CBG. No thinners.

## 2018-03-01 NOTE — ED Notes (Signed)
Bed: [facility] Expected date:  Expected time:  Means of arrival:  Comments: EMS-back pain/dementia

## 2018-03-01 NOTE — ED Provider Notes (Signed)
[place] DEPT Provider Note   CSN: [id number] Arrival date & time: 03/01/18  1009     History   Chief Complaint Chief Complaint  Patient presents with  . Fall    HPI Jacob Norton is a 83 y.o. male.  HPI Patient comes from nursing home with report of unwitnessed fall.  Patient may have fallen out of his chair.  He had been complaining of lower back pain and hip pain predominantly to the left side.  No objective injury observed.  Patient has dementia.  Initially he denied any pain.  He then advised that from the EMS ride over he had some lower back pain.  Patient son reports he does have frequent falls.  At baseline he walks with a walker. Past Medical History:  Diagnosis Date  . Blood transfusion   . Colon cancer ([place])   . Esophageal stricture 2014   S/P Dilation, Dr Deatra Ina (GI)  . Gastric erosions 2014   EGD Dr Deatra Ina.  Neg H. Pylori and malignancy on bx histopath  . 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    . Hyperlipidemia   . Hypertension   . Prostate cancer [facility])     Patient Active Problem List   Diagnosis Date Noted  . Hallucinations 06/23/2014  . Hearing loss associated with syndrome of both ears 04/22/2014  . Imbalance 04/22/2014  . Personal history of colonic polyps 07/10/2013  . GERD (gastroesophageal reflux disease) 05/21/2012  . Vertigo 07/11/2011  . Mixed hyperlipidemia 12/14/2010  . COLON CANCER, HX OF 02/24/2010  . BPH (benign prostatic hyperplasia) 02/23/2010  . Essential hypertension 11/19/2008  . CORONARY ARTERY DISEASE 11/19/2008  . OSTEOARTHRITIS 11/19/2008  . Personal history of malignant neoplasm of prostate 11/19/2008  . CORONARY ARTERY BYPASS GRAFT, HX OF 11/19/2008  . 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 APPENDECTOMY  1965  . CATARACT EXTRACTION Bilateral   . CHOLECYSTECTOMY  03/25/11  . COLON SURGERY  1991  . CORONARY  ANGIOPLASTY WITH STENT PLACEMENT  1994  . CORONARY ARTERY BYPASS GRAFT  1994  . ESOPHAGEAL DILATION  2014   Dr Deatra Ina (GI)  . HERNIA REPAIR  1965  . PROSTATE SURGERY    . TOTAL KNEE ARTHROPLASTY     left        Home Medications    Prior to Admission medications   Medication Sig Start Date End Date Taking? Authorizing Provider  acetaminophen (TYLENOL) 650 MG CR tablet Take 650 mg by mouth 2 (two) times daily.   Yes [provider]  amLODipine (NORVASC) 5 MG tablet Take 5 mg by mouth daily.   Yes [provider]  aspirin 81 MG chewable tablet Chew 81 mg by mouth daily.   Yes [provider]  docusate sodium (COLACE) 100 MG capsule Take 100 mg by mouth 2 (two) times daily.   Yes [provider]  losartan (COZAAR) 50 MG tablet Take 25 mg by mouth daily.    Yes [provider]  metoprolol succinate (TOPROL-XL) 25 MG 24 hr tablet Take 12.5 mg by mouth daily.   Yes [provider]  Multiple Vitamins-Minerals (CENTRUM SILVER PO) Take 1 tablet by mouth daily.     Yes [provider]  nystatin (NYSTATIN) powder Apply 1 g topically daily. Spread topically to perineal area and buttocks once daily   Yes [provider]  nystatin (NYSTATIN) powder  Apply 1 g topically See admin instructions. Spread topically to perineal area and buttocks after bathing at 2 pm and 10 pm   Yes [provider]  polyethylene glycol (MIRALAX / GLYCOLAX) packet Take 17 g by mouth daily as needed for mild constipation. Mix 17 gm in 4-8 oz of suitable liquid and drink by mouth once daily as needed for constipation   Yes [provider]  psyllium (REGULOID) 0.52 g capsule Take 0.52 g by mouth at bedtime.   Yes [provider]  risperiDONE (RISPERDAL) 0.25 MG tablet Take 0.125 mg by mouth at bedtime.   Yes [provider]  senna (SENOKOT) 8.6 MG TABS tablet Take 1 tablet by mouth 3 (three) times a week. Monday, Wednesday  and Friday   Yes [provider]  tamsulosin (FLOMAX) 0.4 MG CAPS capsule Take 0.4 mg by mouth at bedtime.   Yes [provider]  zinc oxide 20 % ointment Apply 1 application topically daily as needed for irritation. Spread topically to area that is red or inflamed once daily to prevent breakdown   Yes [provider]  acetaminophen (TYLENOL) 500 MG tablet Take 2 tablets (1,000 mg total) by mouth every 6 (six) hours as needed. 03/01/18   Charlesetta Shanks, MD  traMADol Veatrice Bourbon) 50 MG tablet 1-2 every 6 hours with tylenol as needed for pain 03/01/18   Charlesetta Shanks, MD    Family History Family History  Problem Relation Age of Onset  . Colon cancer Neg Hx   . Esophageal cancer Neg Hx   . Rectal cancer Neg Hx   . Stomach cancer Neg Hx     Social History Social History   Tobacco Use  . Smoking status: Former Smoker    Types: Cigarettes    Last attempt to quit: 09/13/1995    Years since quitting: 22.4  . Smokeless tobacco: Never Used  Substance Use Topics  . Alcohol use: Yes    Alcohol/week: 0.6 oz    Types: 1 Standard drinks or equivalent per week    Comment: rarely  . Drug use: No     Allergies   Patient has no known allergies.   Review of Systems Review of Systems 10 Systems reviewed and are negative for acute change except as noted in the HPI.   Physical Exam Updated Vital Signs BP (!) 165/76   Pulse 70   Temp 98.1 F (36.7 C) (Oral)   Resp 14   Ht 5\' 8"  (1.727 m)   Wt 83.9 kg (185 lb)   SpO2 96%   BMI 28.13 kg/m   Physical Exam  Constitutional: He appears well-developed and well-nourished.  HENT:  Head: Normocephalic and atraumatic.  Mouth/Throat: Oropharynx is clear and moist.  Eyes: Pupils are equal, round, and reactive to light. EOM are normal.  Neck: Neck supple.  No cervical spine pain to palpation.  Cardiovascular: Normal rate, regular rhythm, normal heart sounds and intact distal pulses.  Pulmonary/Chest: Effort normal and  breath sounds normal.  Abdominal: Soft. Bowel sounds are normal. He exhibits no distension. There is no tenderness.  Musculoskeletal: Normal range of motion. He exhibits no edema or tenderness.  Patient has normal range of motion left upper extremity using spontaneously without pain or deformity.  Patient endorses some chronic problems with the right shoulder.  He however does not have pain with forward flexion or elevation to about 90 degrees.  No appearance of new deformity or severe pain.  I have put both lower extremities  to flexion extension at the hip and the knee.  Patient reports slight discomfort in his lower back with activity.  No severe pain expressed.  Overall patient up on his side and palpate his lower back.  He does not localize pain with palpation in the lower back.  No contusions or abrasions visualized.  Neurological: He is alert. He has normal strength. Coordination normal. GCS eye subscore is 4. GCS verbal subscore is 5. GCS motor subscore is 6.  Patient is alert and pleasantly interactive.  He is very hard of hearing.  Exhibits dementia based on conversation.  Skin: Skin is warm, dry and intact.  Psychiatric: He has a normal mood and affect.     ED Treatments / Results  Labs (all labs ordered are listed, but only abnormal results are displayed) Labs Reviewed - No data to display  EKG None  Radiology Dg Lumbar Spine 2-3 Views  Result Date: 03/01/2018 CLINICAL DATA:  Unwitnessed fall, low back and hip pain EXAM: LUMBAR SPINE - 2-3 VIEW COMPARISON:  MR lumbar spine of 09/24/2014 FINDINGS: There is lumbar curvature convex to the right by 17 degrees. There are diffuse degenerative changes throughout the lumbar spine. There is diffuse degenerative disc disease. No acute compression deformity is seen. Moderate abdominal aortic atherosclerosis is noted. IMPRESSION: 1. No acute compression deformity. 2. Diffuse degenerative disc disease with lumbar scoliosis convex to the right by  17 degrees. Electronically Signed   By: Ivar Drape M.D.   On: 03/01/2018 11:18   Dg Pelvis 1-2 Views  Result Date: 03/01/2018 CLINICAL DATA:  Unwitnessed fall, low back pain, hip pain EXAM: PELVIS - 1-2 VIEW COMPARISON:  CT abdomen pelvis of 03/24/2011 FINDINGS: For age there is only moderate degenerative joint disease of the hips with some loss of joint space and mild spurring. No acute fracture is seen. The pelvic rami are intact. The SI joints appear corticated. Sacral foramina appear normal. There are degenerative changes in the lower lumbar spine. Surgical clips are noted in the region of the prostate. A rounded structure in the right abdomen just above the iliac crest appears to be due to a large renal cyst when compared to prior CT. IMPRESSION: 1. Mild degenerative change of the hips with degenerative change of the lower lumbar spine. 2. No acute fracture. Electronically Signed   By: Ivar Drape M.D.   On: 03/01/2018 11:16   Ct Hip Left Wo Contrast  Result Date: 03/01/2018 CLINICAL DATA:  Left hip pain after fall. EXAM: CT OF THE LEFT HIP WITHOUT CONTRAST TECHNIQUE: Multidetector CT imaging of the left hip was performed according to the standard protocol. Multiplanar CT image reconstructions were also generated. COMPARISON:  Pelvic x-rays from same day. CT abdomen pelvis dated March 24, 2011. FINDINGS: Bones/Joint/Cartilage No acute fracture or dislocation. Mild left hip joint space narrowing. No joint effusion. Ligaments Suboptimally assessed by CT. Muscles and Tendons Grossly unremarkable. 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 No fluid collection or hematoma. Vascular calcifications. Brachytherapy seeds within the prostate gland. IMPRESSION: 1.  No acute osseous abnormality. 2. Partially visualized large lipoma between the tensor fascia lata and vastus lateralis muscles. Electronically Signed   By: Titus Dubin M.D.   On:  03/01/2018 13:00    Procedures Procedures (including critical care time)  Medications Ordered in ED Medications  acetaminophen (TYLENOL) tablet 1,000 mg (1,000 mg Oral Given 03/01/18 1153)     Initial Impression / Assessment and Plan /  ED Course  I have reviewed the triage vital signs and the nursing notes.  Pertinent labs & imaging results that were available during my care of the patient were reviewed by me and considered in my medical decision making (see chart for details).     Recheck 11: 45 patient has gotten up with walker but not ambulating at baseline.  Complaining of left hip pain and having difficulty moving forward using left lower extremity.  We will proceed with CT of the hip.  Final Clinical Impressions(s) / ED Diagnoses   Final diagnoses:  Fall, initial encounter  Contusion of left hip, initial encounter  Strain of left hip, initial encounter   CT does not show occult fracture.  At this time, pain most likely due to strain and contusion of the hip and lower back.  Patient is able to bear weight but has pain.  Plan will be to return to assisted living.  Patient can be maintained with wheelchair and pivot transfers.  Plan for acetaminophen and tramadol for pain control.  Patient will need follow-up with facility provider in the next 3 to 7 days for reevaluation.  Findings and plan reviewed with the patient's son who is at bedside. ED Discharge Orders        Ordered    acetaminophen (TYLENOL) 500 MG tablet  Every 6 hours PRN     03/01/18 1334    traMADol (ULTRAM) 50 MG tablet     03/01/18 1334       Charlesetta Shanks, MD 03/01/18 1336

## 2019-04-15 IMAGING — CT CT HIP*L* W/O CM
2 of 3 series · 17 of 46 positions shown, 19 images · non-contrast
Comparison: Pelvic x-rays from same day. CT abdomen pelvis dated
March 24, 2011.

CLINICAL DATA: Left hip pain after fall.

EXAM:
CT OF THE LEFT HIP WITHOUT CONTRAST
TECHNIQUE: Multidetector CT imaging of the left hip was performed according to
the standard protocol. Multiplanar CT image reconstructions were
also generated.

[Series 8: coronal st · coronal · 0.39mm/px · 3 of 92 slices shown]
[im 31/92  soft-tissue]
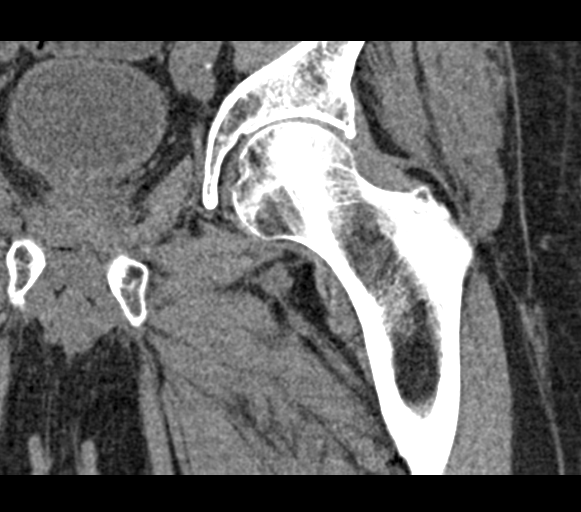
[im 41/92  soft-tissue]
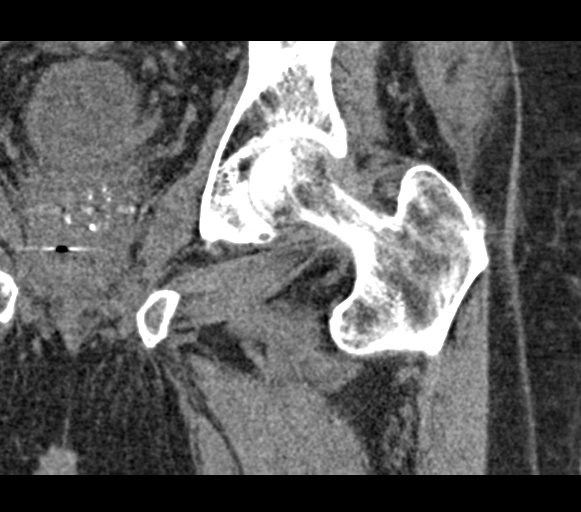
[im 51/92  soft-tissue]
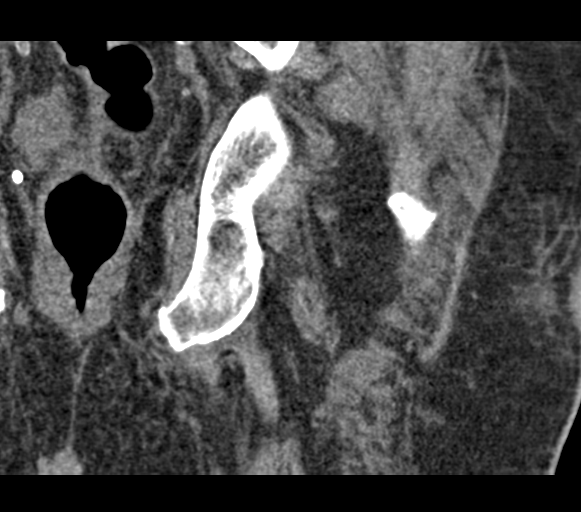

[Series 11: axial st · axial · 0.45mm/px · z∈[+1059,+1205]mm · 14 of 85 slices shown, 16 images]
[im 6/85  soft-tissue]
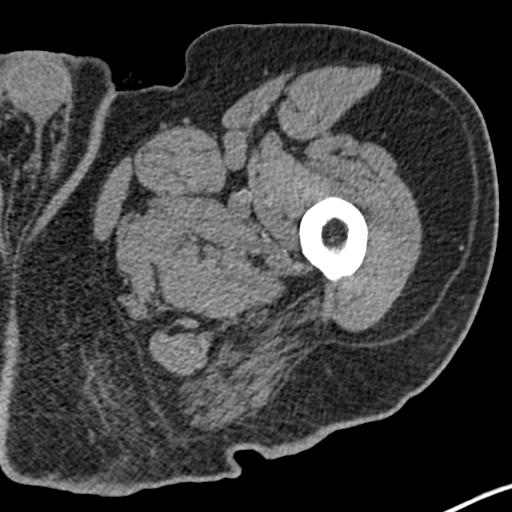
[im 6/85  bone]
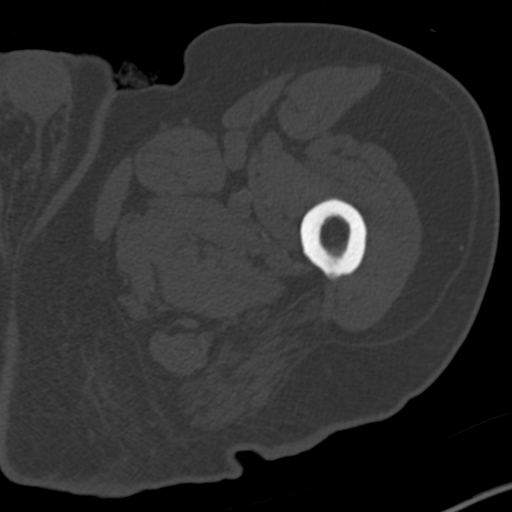
[im 11/85  soft-tissue]
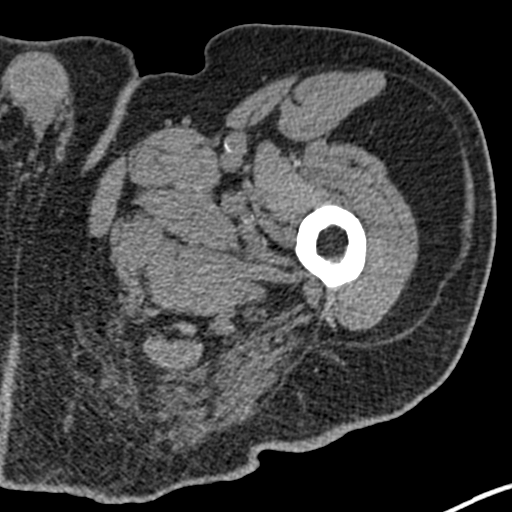
[im 17/85  soft-tissue]
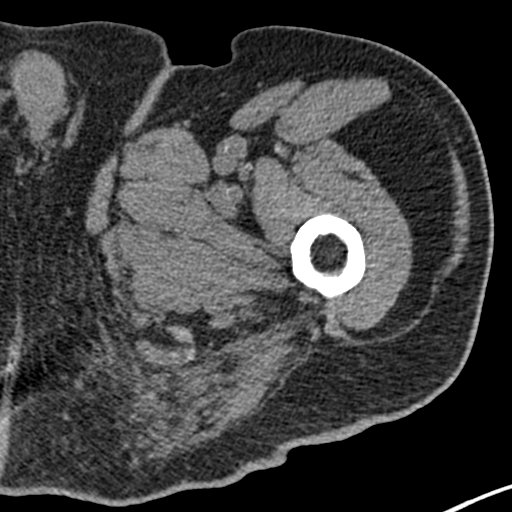
[im 22/85  soft-tissue]
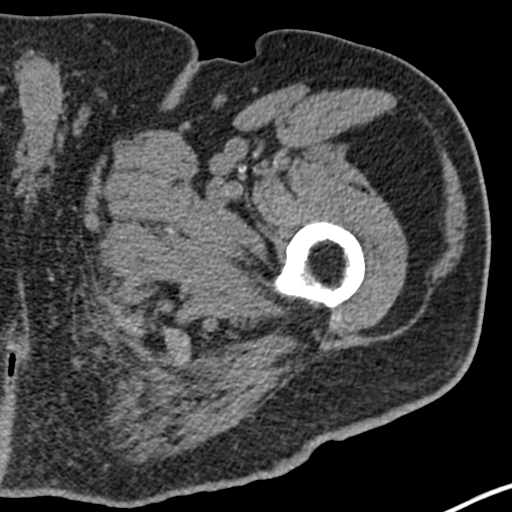
[im 28/85  soft-tissue]
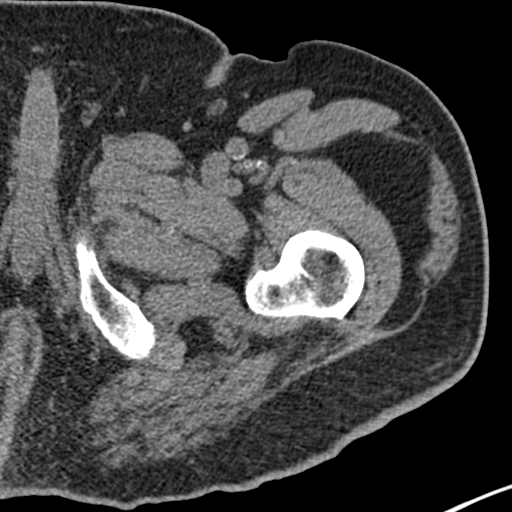
[im 33/85  soft-tissue]
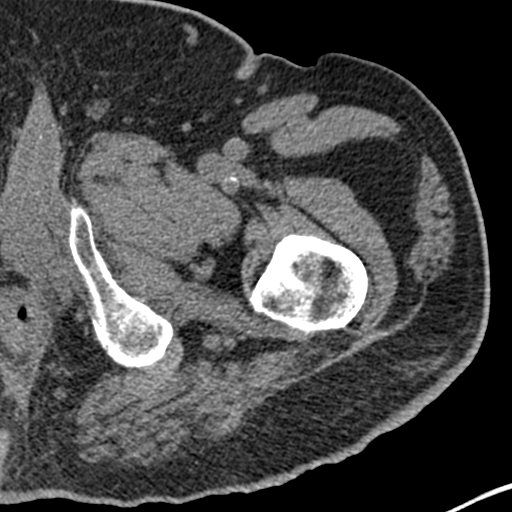
[im 38/85  soft-tissue]
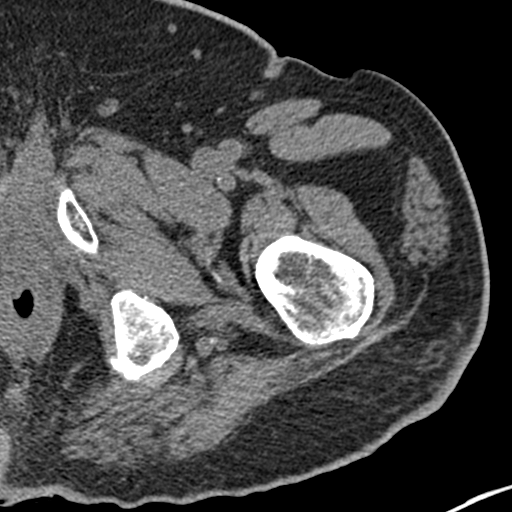
[im 47/85  soft-tissue]
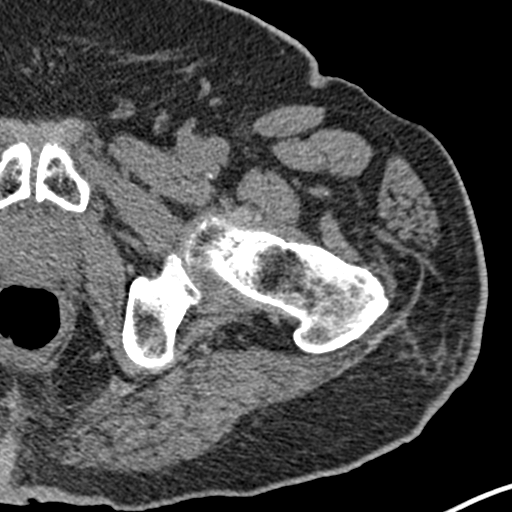
[im 52/85  soft-tissue]
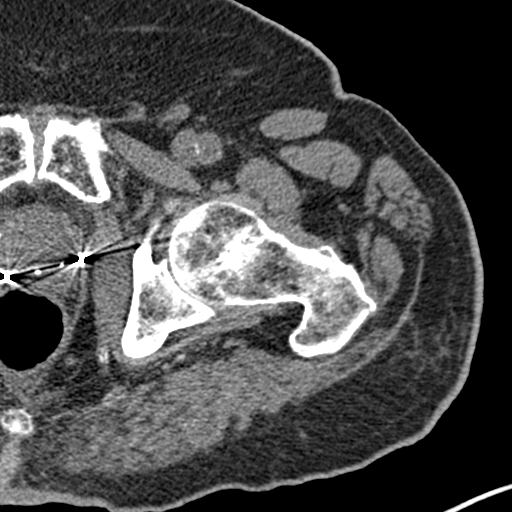
[im 52/85  bone]
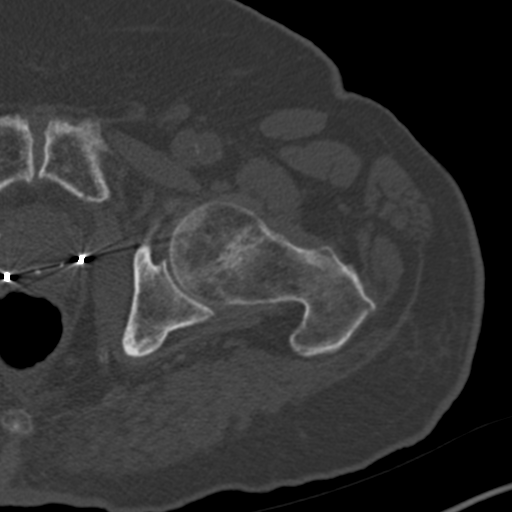
[im 57/85  soft-tissue]
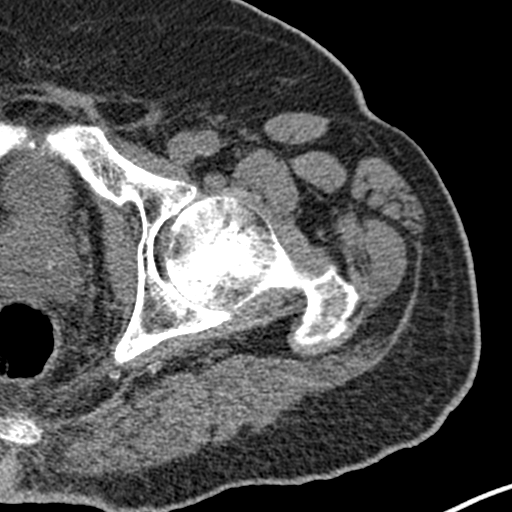
[im 63/85  soft-tissue]
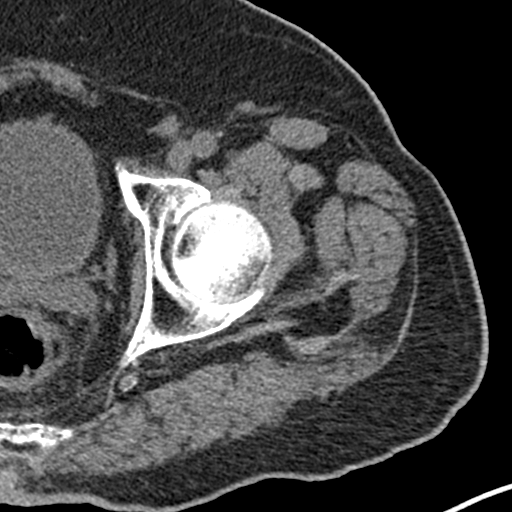
[im 68/85  soft-tissue]
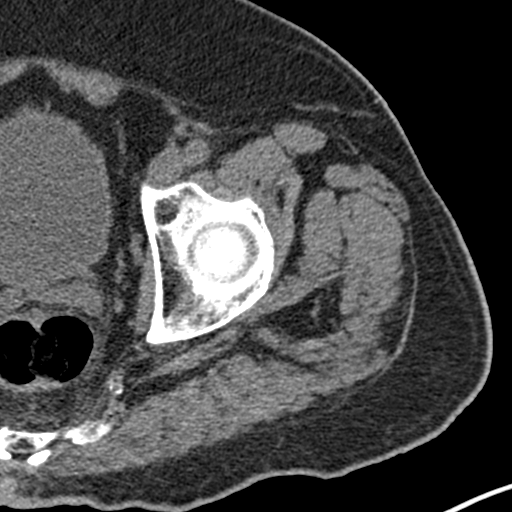
[im 74/85  soft-tissue]
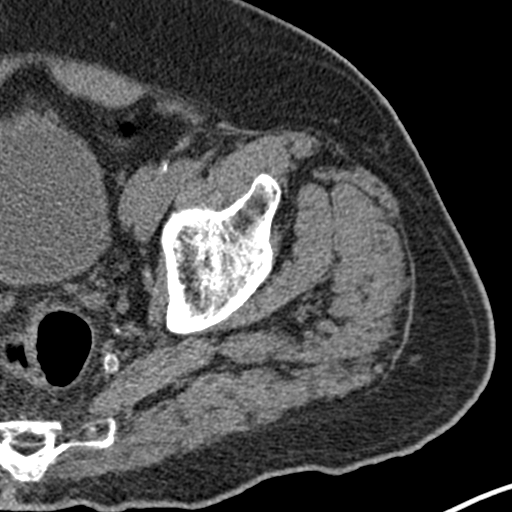
[im 79/85  soft-tissue]
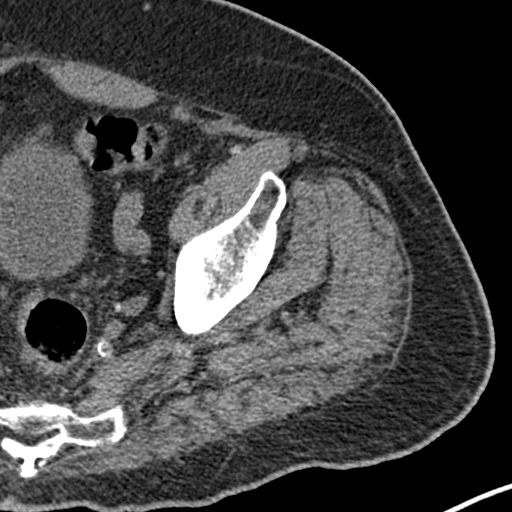

[17 of 46 positions shown; findings below may reference images not displayed]

FINDINGS: Bones/Joint/Cartilage

No acute fracture or dislocation. Mild left hip joint space
narrowing. No joint effusion.

Ligaments

Suboptimally assessed by CT.

Muscles and Tendons

Grossly unremarkable.

Soft tissues

Partially visualized large lipoma between the tensor fascia lata and
vastus lateralis muscles. No fluid collection or hematoma. Vascular
calcifications. Brachytherapy seeds within the prostate gland.
IMPRESSION: 1.  No acute osseous abnormality.
2. Partially visualized large lipoma between the tensor fascia lata
and vastus lateralis muscles.

## 2019-07-09 ENCOUNTER — Encounter (INDEPENDENT_AMBULATORY_CARE_PROVIDER_SITE_OTHER): Payer: Self-pay

## 2020-08-12 DEATH — deceased
